# Patient Record
Sex: Female | Born: 1990 | Race: White | Hispanic: No | Marital: Single | Smoking: Never smoker
Health system: Southern US, Community
[De-identification: ages and names within clinical notes are randomized; demographics above are authoritative.]

## PROBLEM LIST (undated history)

## (undated) ENCOUNTER — Inpatient Hospital Stay (HOSPITAL_COMMUNITY): Payer: Self-pay

## (undated) DIAGNOSIS — R519 Headache, unspecified: Secondary | ICD-10-CM

## (undated) DIAGNOSIS — Z22322 Carrier or suspected carrier of Methicillin resistant Staphylococcus aureus: Secondary | ICD-10-CM

## (undated) DIAGNOSIS — S82891A Other fracture of right lower leg, initial encounter for closed fracture: Secondary | ICD-10-CM

## (undated) DIAGNOSIS — S82892A Other fracture of left lower leg, initial encounter for closed fracture: Secondary | ICD-10-CM

## (undated) DIAGNOSIS — R51 Headache: Secondary | ICD-10-CM

## (undated) HISTORY — DX: Headache: R51

## (undated) HISTORY — DX: Other fracture of right lower leg, initial encounter for closed fracture: S82.891A

## (undated) HISTORY — DX: Headache, unspecified: R51.9

## (undated) HISTORY — DX: Other fracture of right lower leg, initial encounter for closed fracture: S82.892A

## (undated) HISTORY — DX: Carrier or suspected carrier of methicillin resistant Staphylococcus aureus: Z22.322

## (undated) HISTORY — PX: OTHER SURGICAL HISTORY: SHX169

---

## 2012-05-10 ENCOUNTER — Ambulatory Visit: Payer: Self-pay | Admitting: Family Medicine

## 2012-05-10 DIAGNOSIS — Z0289 Encounter for other administrative examinations: Secondary | ICD-10-CM

## 2014-03-28 ENCOUNTER — Telehealth: Payer: Self-pay

## 2014-03-28 NOTE — Telephone Encounter (Signed)
Left message for call back Non-identifiable   NEW PATIENT 

## 2014-03-28 NOTE — Telephone Encounter (Signed)
Medication and allergies:  Reviewed and updated  90 day supply/mail order: n/a Local pharmacy:  [id number] [place] - [address]   Immunizations due:  UTD   A/P: No changes to personal, family history or past surgical hx PAP- Never had one Flu- Did not receive Tdap- 06/2012   To Discuss with Provider: Nothing at this time.

## 2014-03-31 ENCOUNTER — Encounter: Payer: Self-pay | Admitting: Family Medicine

## 2014-03-31 ENCOUNTER — Encounter: Payer: Self-pay | Admitting: General Practice

## 2014-03-31 ENCOUNTER — Ambulatory Visit (INDEPENDENT_AMBULATORY_CARE_PROVIDER_SITE_OTHER): Payer: BC Managed Care – PPO | Admitting: Family Medicine

## 2014-03-31 VITALS — BP 118/76 | HR 75 | Temp 98.8°F | Resp 14 | Ht 68.0 in | Wt 242.2 lb

## 2014-03-31 DIAGNOSIS — Z0000 Encounter for general adult medical examination without abnormal findings: Secondary | ICD-10-CM

## 2014-03-31 LAB — BASIC METABOLIC PANEL
BUN: 11 mg/dL (ref 6–23)
CO2: 25 mEq/L (ref 19–32)
Calcium: 9.5 mg/dL (ref 8.4–10.5)
Chloride: 104 mEq/L (ref 96–112)
Creatinine, Ser: 0.8 mg/dL (ref 0.4–1.2)
GFR: 93.64 mL/min (ref >60.00)
Glucose, Bld: 81 mg/dL (ref 70–99)
Potassium: 3.8 mEq/L (ref 3.5–5.1)
Sodium: 138 mEq/L (ref 135–145)

## 2014-03-31 LAB — LIPID PANEL
CHOLESTEROL: 207 mg/dL — AB (ref 0–200)
HDL: 43.9 mg/dL (ref >39.00)
LDL CALC: 144 mg/dL — AB (ref 0–99)
TRIGLYCERIDES: 96 mg/dL (ref 0.0–149.0)
Total CHOL/HDL Ratio: 5
VLDL: 19.2 mg/dL (ref 0.0–40.0)

## 2014-03-31 LAB — CBC WITH DIFFERENTIAL/PLATELET
Basophils Absolute: 0 10*3/uL (ref 0.0–0.1)
Basophils Relative: 0.5 % (ref 0.0–3.0)
EOS PCT: 0.8 % (ref 0.0–5.0)
Eosinophils Absolute: 0.1 10*3/uL (ref 0.0–0.7)
HCT: 40.1 % (ref 36.0–46.0)
HEMOGLOBIN: 13.3 g/dL (ref 12.0–15.0)
Lymphocytes Relative: 22.4 % (ref 12.0–46.0)
Lymphs Abs: 1.6 10*3/uL (ref 0.7–4.0)
MCHC: 33.1 g/dL (ref 30.0–36.0)
MCV: 87.5 fl (ref 78.0–100.0)
MONOS PCT: 4.5 % (ref 3.0–12.0)
Monocytes Absolute: 0.3 10*3/uL (ref 0.1–1.0)
Neutro Abs: 5.2 10*3/uL (ref 1.4–7.7)
Neutrophils Relative %: 71.8 % (ref 43.0–77.0)
PLATELETS: 351 10*3/uL (ref 150.0–400.0)
RBC: 4.58 Mil/uL (ref 3.87–5.11)
RDW: 13.5 % (ref 11.5–15.5)
WBC: 7.3 10*3/uL (ref 4.0–10.5)

## 2014-03-31 LAB — HEPATIC FUNCTION PANEL
ALT: 15 U/L (ref 0–35)
AST: 18 U/L (ref 0–37)
Albumin: 4.1 g/dL (ref 3.5–5.2)
Alkaline Phosphatase: 57 U/L (ref 39–117)
Bilirubin, Direct: 0 mg/dL (ref 0.0–0.3)
Total Bilirubin: 0.7 mg/dL (ref 0.2–1.2)
Total Protein: 8 g/dL (ref 6.0–8.3)

## 2014-03-31 LAB — TSH: TSH: 2.16 u[IU]/mL (ref 0.35–4.50)

## 2014-03-31 NOTE — Patient Instructions (Signed)
Schedule your pap only at your convenience (when you're home this summer) We'll notify you of your lab results and make any changes if needed Continue to make healthy food choices and get regular exercise Call with any questions or concerns Welcome!  We're glad to have you!!!

## 2014-03-31 NOTE — Assessment & Plan Note (Signed)
Pt's PE WNL w/ exception of obesity.  Check labs.  Encouraged healthy diet and regular exercise.  Anticipatory guidance provided.  

## 2014-03-31 NOTE — Progress Notes (Signed)
Pre visit review using our clinic review tool, if applicable. No additional management support is needed unless otherwise documented below in the visit note. 

## 2014-03-31 NOTE — Progress Notes (Signed)
   Subjective:    Patient ID: Katrina Perez, female    DOB: [birth year], 23 y.o.   MRN: [id number]  HPI New to establish.  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  CPE- no concerns.  Has never had pap.  Has been sexually active.   Review of Systems Patient reports no vision/ hearing changes, adenopathy,fever, weight change,  persistant/recurrent hoarseness , swallowing issues, chest pain, palpitations, edema, persistant/recurrent cough, hemoptysis, dyspnea (rest/exertional/paroxysmal nocturnal), gastrointestinal bleeding (melena, rectal bleeding), abdominal pain, significant heartburn, bowel changes, GU symptoms (dysuria, hematuria, incontinence), Gyn symptoms (abnormal  bleeding, pain),  syncope, focal weakness, memory loss, numbness & tingling, skin/hair/nail changes, abnormal bruising or bleeding, anxiety, or depression.     Objective:   Physical Exam General Appearance:    Alert, cooperative, no distress, appears stated age  Head:    Normocephalic, without obvious abnormality, atraumatic  Eyes:    PERRL, conjunctiva/corneas clear, EOM's intact, fundi    benign, both eyes  Ears:    Normal TM's and external ear canals, both ears  Nose:   Nares normal, septum midline, mucosa normal, no drainage    or sinus tenderness  Throat:   Lips, mucosa, and tongue normal; teeth and gums normal  Neck:   Supple, symmetrical, trachea midline, no adenopathy;    Thyroid: no enlargement/tenderness/nodules  Back:     Symmetric, no curvature, ROM normal, no CVA tenderness  Lungs:     Clear to auscultation bilaterally, respirations unlabored  Chest Wall:    No tenderness or deformity   Heart:    Regular rate and rhythm, S1 and S2 normal, no murmur, rub   or gallop  Breast Exam:    Deferred  Abdomen:     Soft, non-tender, bowel sounds active all four quadrants,    no masses, no organomegaly  Genitalia:    Deferred  Rectal:    Extremities:   Extremities normal, atraumatic, no cyanosis or edema    Pulses:   2+ and symmetric all extremities  Skin:   Skin color, texture, turgor normal, no rashes or lesions  Lymph nodes:   Cervical, supraclavicular, and axillary nodes normal  Neurologic:   CNII-XII intact, normal strength, sensation and reflexes    throughout          Assessment & Plan:

## 2014-04-05 LAB — VITAMIN D 1,25 DIHYDROXY
VITAMIN D 1, 25 (OH) TOTAL: 41 pg/mL (ref 18–72)
Vitamin D3 1, 25 (OH)2: 41 pg/mL

## 2017-01-30 ENCOUNTER — Ambulatory Visit (INDEPENDENT_AMBULATORY_CARE_PROVIDER_SITE_OTHER): Payer: Medicaid Other | Admitting: Advanced Practice Midwife

## 2017-01-30 ENCOUNTER — Encounter: Payer: Self-pay | Admitting: Advanced Practice Midwife

## 2017-01-30 ENCOUNTER — Other Ambulatory Visit (HOSPITAL_COMMUNITY)
Admission: RE | Admit: 2017-01-30 | Discharge: 2017-01-30 | Disposition: A | Discharge status: Home / Self Care | Payer: Medicaid Other | Source: Ambulatory Visit | Attending: Advanced Practice Midwife | Admitting: Advanced Practice Midwife

## 2017-01-30 VITALS — BP 138/89 | HR 106 | Wt 270.0 lb

## 2017-01-30 DIAGNOSIS — O219 Vomiting of pregnancy, unspecified: Secondary | ICD-10-CM | POA: Diagnosis not present

## 2017-01-30 DIAGNOSIS — O0932 Supervision of pregnancy with insufficient antenatal care, second trimester: Secondary | ICD-10-CM | POA: Diagnosis not present

## 2017-01-30 DIAGNOSIS — Z3402 Encounter for supervision of normal first pregnancy, second trimester: Secondary | ICD-10-CM

## 2017-01-30 DIAGNOSIS — Z113 Encounter for screening for infections with a predominantly sexual mode of transmission: Secondary | ICD-10-CM | POA: Diagnosis not present

## 2017-01-30 DIAGNOSIS — O09899 Supervision of other high risk pregnancies, unspecified trimester: Secondary | ICD-10-CM | POA: Insufficient documentation

## 2017-01-30 DIAGNOSIS — E669 Obesity, unspecified: Secondary | ICD-10-CM | POA: Diagnosis not present

## 2017-01-30 DIAGNOSIS — O0930 Supervision of pregnancy with insufficient antenatal care, unspecified trimester: Secondary | ICD-10-CM | POA: Insufficient documentation

## 2017-01-30 DIAGNOSIS — O99212 Obesity complicating pregnancy, second trimester: Secondary | ICD-10-CM | POA: Diagnosis not present

## 2017-01-30 DIAGNOSIS — O99210 Obesity complicating pregnancy, unspecified trimester: Secondary | ICD-10-CM

## 2017-01-30 DIAGNOSIS — Z3A16 16 weeks gestation of pregnancy: Secondary | ICD-10-CM

## 2017-01-30 MED ORDER — DOXYLAMINE-PYRIDOXINE 10-10 MG PO TBEC: 2.0000 | DELAYED_RELEASE_TABLET | Freq: Every day | ORAL | 5 refills | Status: DC

## 2017-01-30 NOTE — Patient Instructions (Signed)
Second Trimester of Pregnancy The second trimester is from week 13 through week 28, month 4 through 6. This is often the time in pregnancy that you feel your best. Often times, morning sickness has lessened or quit. You may have more energy, and you may get hungry more often. Your unborn baby (fetus) is growing rapidly. At the end of the sixth month, he or she is about 9 inches long and weighs about 1 pounds. You will likely feel the baby move (quickening) between 18 and 20 weeks of pregnancy. Follow these instructions at home:  Avoid all smoking, herbs, and alcohol. Avoid drugs not approved by your doctor.  Do not use any tobacco products, including cigarettes, chewing tobacco, and electronic cigarettes. If you need help quitting, ask your doctor. You may get counseling or other support to help you quit.  Only take medicine as told by your doctor. Some medicines are safe and some are not during pregnancy.  Exercise only as told by your doctor. Stop exercising if you start having cramps.  Eat regular, healthy meals.  Wear a good support bra if your breasts are tender.  Do not use hot tubs, steam rooms, or saunas.  Wear your seat belt when driving.  Avoid raw meat, uncooked cheese, and liter boxes and soil used by cats.  Take your prenatal vitamins.  Take 1500-2000 milligrams of calcium daily starting at the 20th week of pregnancy until you deliver your baby.  Try taking medicine that helps you poop (stool softener) as needed, and if your doctor approves. Eat more fiber by eating fresh fruit, vegetables, and whole grains. Drink enough fluids to keep your pee (urine) clear or pale yellow.  Take warm water baths (sitz baths) to soothe pain or discomfort caused by hemorrhoids. Use hemorrhoid cream if your doctor approves.  If you have puffy, bulging veins (varicose veins), wear support hose. Raise (elevate) your feet for 15 minutes, 3-4 times a day. Limit salt in your diet.  Avoid heavy  lifting, wear low heals, and sit up straight.  Rest with your legs raised if you have leg cramps or low back pain.  Visit your dentist if you have not gone during your pregnancy. Use a soft toothbrush to brush your teeth. Be gentle when you floss.  You can have sex (intercourse) unless your doctor tells you not to.  Go to your doctor visits. Get help if:  You feel dizzy.  You have mild cramps or pressure in your lower belly (abdomen).  You have a nagging pain in your belly area.  You continue to feel sick to your stomach (nauseous), throw up (vomit), or have watery poop (diarrhea).  You have bad smelling fluid coming from your vagina.  You have pain with peeing (urination). Get help right away if:  You have a fever.  You are leaking fluid from your vagina.  You have spotting or bleeding from your vagina.  You have severe belly cramping or pain.  You lose or gain weight rapidly.  You have trouble catching your breath and have chest pain.  You notice sudden or extreme puffiness (swelling) of your face, hands, ankles, feet, or legs.  You have not felt the baby move in over an hour.  You have severe headaches that do not go away with medicine.  You have vision changes. This information is not intended to replace advice given to you by your health care provider. Make sure you discuss any questions you have with your health care   provider. Document Released: 02/01/2010 Document Revised: 04/14/2016 Document Reviewed: 01/08/2013 Elsevier Interactive Patient Education  2017 Elsevier Inc.  

## 2017-01-30 NOTE — Addendum Note (Signed)
Addended by: Granville LewisLARK, Luverta Korte L on: 01/30/2017 02:49 PM   Modules accepted: Orders

## 2017-01-30 NOTE — Progress Notes (Signed)
[place] [facility] [place] 1 [place]2 [place].  WNL

## 2017-01-30 NOTE — Progress Notes (Signed)
Subjective:    Katrina Perez is a G1P0 [redacted]w[redacted]d being seen today for her first obstetrical visit.  Her obstetrical history is significant for obesity and Late prenatal care, nausea and vomiting of pregnancy. Patient does intend to breast feed. Pregnancy history fully reviewed.  Patient reports nausea and vomiting.  Vitals:   01/30/17 0944  BP: 138/89  Pulse: (!) 106  Weight: 270 lb (122.5 kg)    HISTORY: OB History  Gravida Para Term Preterm AB Living  1            SAB TAB Ectopic Multiple Live Births               # Outcome Date GA Lbr Len/2nd Weight Sex Delivery Anes PTL Lv  1 Current              Past Medical History:  Diagnosis Date  . Bilateral ankle fractures   . Frequent headaches    tension headaches per patient  . MRSA (methicillin resistant staph aureus) culture positive    bilater armpits   Past Surgical History:  Procedure Laterality Date  . left hip surgery     "replace hip in socket"   Family History  Problem Relation Age of Onset  . Hypertension      grandparents on both sides  . Heart attack Maternal Grandfather   . Hyperlipidemia Maternal Grandfather   . Diabetes Maternal Grandfather   . Colon cancer      paternal great grandfather  . Depression Mother     and mother's side of family  . Bipolar disorder Mother     and mother's side of family  . Anxiety disorder Mother     and mother's side of family  . Diabetes Maternal Aunt      Exam    Uterus:  Fundal Height: 16 cm  Pelvic Exam:    Perineum: Not examined   Vulva: deferred   Vagina:  normal discharge   pH:    Cervix: nulliparous appearance   Adnexa: not evaluated   Bony Pelvis: gynecoid  System: Breast:  normal appearance, no masses or tenderness   Skin: normal coloration and turgor, no rashes    Neurologic: oriented   Extremities: normal strength, tone, and muscle mass   HEENT neck supple with midline trachea   Mouth/Teeth mucous membranes moist, pharynx normal without  lesions   Neck supple and no masses   Cardiovascular: regular rate and rhythm   Respiratory:  appears well, vitals normal, no respiratory distress, acyanotic, normal RR, ear and throat exam is normal, neck free of mass or lymphadenopathy, chest clear, no wheezing, crepitations, rhonchi, normal symmetric air entry   Abdomen: soft, non-tender; bowel sounds normal; no masses,  no organomegaly   Urinary: bladder not palpable      Assessment:    Pregnancy: G1P0 Patient Active Problem List   Diagnosis Date Noted  . Supervision of normal first pregnancy 01/30/2017  . Late prenatal care 01/30/2017  . Nausea/vomiting in pregnancy 01/30/2017  . Routine general medical examination at a health care facility 03/31/2014        Plan:     Initial labs drawn. Prenatal vitamins. Problem list reviewed and updated. Genetic Screening discussed Quad Screen: ordered.  Ultrasound discussed; fetal survey: ordered.  Follow up in 4 weeks. 50% of 30 min visit spent on counseling and coordination of care.   Welcomed to practice Routines reviewed Will Rx Diclegis for nausea/vomitiing which has persisted Discussed if this does not  work, may move to [employer]henergan or Zofran, Reviewed small risk of defects with zofran, but will used if necessary.   Wynelle BourgeoisMarie Chrishana Spargur 01/30/2017

## 2017-01-31 LAB — AFP, QUAD SCREEN
AFP: 50.1 ng/mL
Age Alone: 1:1030 {titer}
Curr Gest Age: 16 weeks
Down Syndrome Scr Risk Est: 1:28500 {titer}
HCG, Total: 28.6 IU/mL
INH: 165.2 pg/mL
Interpretation-AFP: NEGATIVE
MoM for AFP: 2.31
MoM for INH: 1.5
MoM for hCG: 1.1
OPEN SPINA BIFIDA: NEGATIVE
Tri 18 Scr Risk Est: NEGATIVE
UE3 MOM: 0.72
UE3 VALUE: 0.47 ng/mL

## 2017-01-31 LAB — PRENATAL PROFILE (SOLSTAS)
Antibody Screen: NEGATIVE
Basophils Absolute: 0 cells/uL (ref 0–200)
Basophils Relative: 0 %
EOS ABS: 98 {cells}/uL (ref 15–500)
Eosinophils Relative: 1 %
HCT: 35 % (ref 35.0–45.0)
HIV: NONREACTIVE
Hemoglobin: 11.8 g/dL (ref 11.7–15.5)
Hepatitis B Surface Ag: NEGATIVE
LYMPHS PCT: 15 %
Lymphs Abs: 1470 cells/uL (ref 850–3900)
MCH: 29.5 pg (ref 27.0–33.0)
MCHC: 33.7 g/dL (ref 32.0–36.0)
MCV: 87.5 fL (ref 80.0–100.0)
MONOS PCT: 7 %
MPV: 9.5 fL (ref 7.5–12.5)
Monocytes Absolute: 686 cells/uL (ref 200–950)
Neutro Abs: 7546 cells/uL (ref 1500–7800)
Neutrophils Relative %: 77 %
PLATELETS: 301 10*3/uL (ref 140–400)
RBC: 4 MIL/uL (ref 3.80–5.10)
RDW: 13.7 % (ref 11.0–15.0)
RH TYPE: POSITIVE
Rubella: 1.08 Index — ABNORMAL HIGH (ref <0.90)
WBC: 9.8 10*3/uL (ref 3.8–10.8)

## 2017-02-01 LAB — URINE CYTOLOGY ANCILLARY ONLY
Chlamydia: NEGATIVE
Neisseria Gonorrhea: NEGATIVE

## 2017-02-02 LAB — CULTURE, OB URINE

## 2017-02-05 ENCOUNTER — Other Ambulatory Visit: Payer: Self-pay | Admitting: Advanced Practice Midwife

## 2017-02-05 ENCOUNTER — Encounter: Payer: Self-pay | Admitting: Advanced Practice Midwife

## 2017-02-05 MED ORDER — CEPHALEXIN 500 MG PO CAPS: 500.0000 mg | ORAL_CAPSULE | Freq: Four times a day (QID) | ORAL | 2 refills | Status: DC

## 2017-02-05 NOTE — Progress Notes (Signed)
E coli UTI  Rx Keflex

## 2017-02-23 ENCOUNTER — Other Ambulatory Visit: Payer: Self-pay | Admitting: Advanced Practice Midwife

## 2017-02-23 ENCOUNTER — Ambulatory Visit (HOSPITAL_COMMUNITY)
Admission: RE | Admit: 2017-02-23 | Discharge: 2017-02-23 | Disposition: A | Discharge status: Home / Self Care | Payer: Medicaid Other | Source: Ambulatory Visit | Attending: Advanced Practice Midwife | Admitting: Advanced Practice Midwife

## 2017-02-23 ENCOUNTER — Other Ambulatory Visit (HOSPITAL_COMMUNITY): Payer: Self-pay | Admitting: *Deleted

## 2017-02-23 DIAGNOSIS — Z3A16 16 weeks gestation of pregnancy: Secondary | ICD-10-CM

## 2017-02-23 DIAGNOSIS — Z3A19 19 weeks gestation of pregnancy: Secondary | ICD-10-CM

## 2017-02-23 DIAGNOSIS — O99212 Obesity complicating pregnancy, second trimester: Secondary | ICD-10-CM | POA: Insufficient documentation

## 2017-02-23 DIAGNOSIS — IMO0002 Reserved for concepts with insufficient information to code with codable children: Secondary | ICD-10-CM

## 2017-02-23 DIAGNOSIS — Z3689 Encounter for other specified antenatal screening: Secondary | ICD-10-CM | POA: Insufficient documentation

## 2017-02-23 DIAGNOSIS — Z363 Encounter for antenatal screening for malformations: Secondary | ICD-10-CM | POA: Diagnosis not present

## 2017-02-23 DIAGNOSIS — Q899 Congenital malformation, unspecified: Secondary | ICD-10-CM

## 2017-02-23 DIAGNOSIS — Z6841 Body Mass Index (BMI) 40.0 and over, adult: Secondary | ICD-10-CM | POA: Insufficient documentation

## 2017-02-23 DIAGNOSIS — Z0489 Encounter for examination and observation for other specified reasons: Secondary | ICD-10-CM

## 2017-02-23 DIAGNOSIS — O283 Abnormal ultrasonic finding on antenatal screening of mother: Secondary | ICD-10-CM

## 2017-02-23 NOTE — Progress Notes (Addendum)
Genetic Counseling  High-Risk Gestation Note  Appointment Date:  02/23/2017 Referred By: Seabron Spates, CNM Date of Birth:  10/16/1991 Partner:  Katrina Perez   Pregnancy History: G1P0 Estimated Date of Delivery: 07/17/17 Estimated Gestational Age: 38w3dAttending: [place] MD  I met with Ms. Katrina Perez and her partner, Katrina Perez for genetic counseling because of abnormal ultrasound findings today.   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  Reviewed possible etiologies  Findings in combination with borderline AFP (2.31 MoM), suspicious for open neural tube defect, though some views of fetal spine were limited today  Sporadic, environmental, infectious, chromosomal, single gene  Reviewed options for additional screening  NIPS- elected to pursue today ([facility])  Repeat MSAFP (to assess for regression to the mean) - elected to pursue today[facility]  Echocardiogram- being facilitated for the patient  Ongoing ultrasound- follow-up scheduled 03/16/17  Fetal MRI at later gestation- patient to consider this at later date  Expanded pan-ethnic carrier screening panel - declined today  Reviewed options for diagnostic testing, including risks, benefits, limitations and alternatives  Amniocentesis for AFAFP and chromosome analysis - declined today  Reviewed family history concerns  We began by reviewing the ultrasound in detail. [facility] visualized bilateral ventriculomegaly (13 mm bilaterally), banana shaped cerebellum, and lemon shaped cranium. No obvious defects of fetal spine were visualized today, but limited views of sacral spine were  obtained today. Bilateral echogenic intracardiac foci were visualized. Complete ultrasound results under separate cover.   We counseled the couple that ventriculomegaly refers to an increased measurement of the lateral ventricles in the brain greater than 10 mm. Possible causes for mild ventriculomegaly include overproduction of cerebrospinal fluid, a blockage in a duct causing the fluid to accumulate, an intrauterine infection, or a variation of normal. We discussed that once ventriculomegaly is identified in pregnancy, follow-up ultrasounds are offered to assess for progression of ventriculomegaly. Following delivery, post-natal studies may be required in order to track progress and assess for any other differences in brain anatomy. It was discussed that surgical intervention may be required in some cases of ventriculomegaly. Most cases of isolated mild ventriculomegaly do not have an identified cause or associated condition, tend to regress with time, and have no resulting health or learning problems. However, it is possible that this finding may be seen in association with other birth defects or a genetic condition. There is also a slightly increased chance of developmental delays.   We specifically discussed that the brain and head anatomy are consistent with Katrina AwkwardChiari II malformation, which is expected to be present due to an open neural tube defect. We discussed that suspicion for open spina bifida, though no obvious defect was visualized on the fetal spine today. However, views of the fetal sacral spine were quite limited today.  Ms. MSolarzpreviously had Quad screening, which was within normal limits, but the MSAFP was in the borderline range (2.31 MoM).    We briefly discussed the range in variability of open neural tube defects. In cases of open neural tube defects, surgery to close the opening in the spine is performed within the first 24 to 48 hours after birth for most cases.  Additionally,  there is limited availability of  in utero NTD repair.  They understand that this is available at several centers in the [place], but in order to be eligible, there are certain criteria which must be met. We discussed additional screening and testing options to assess for ONTDs in the current pregnancy including repeating MSAFP (to assess for regression to the mean), fetal MRI at a later gestation, repeat ultrasound, and amniocentesis to assess amniotic fluid AFP and acetylcholinesterase. We reviewed the risks, benefits, and limitations of each of these screening and testing methods. Specifically, for amniocentesis, we discussed the associated 1 in 300-500 risk for complications, including spontaneous pregnancy loss. After careful consideration, Katrina Perez elected to repeat MSAFP (sent to [facility], given that this is the laboratory that analyzed the first Quad screen sample) and declined amniocentesis today. Follow-up ultrasound is scheduled for 03/16/17, and the patient would like to pursue fetal MRI at a later date.   We discussed less likely explanations for the ultrasound findings. We discussed that the presence of ventriculomegaly is associated with an increased risk for aneuploidy, specifically Down syndrome. We reviewed chromosomes, nondisjunction, and the features and prognosis of Down syndrome, trisomy 18, and trisomy 13. They were counseled regarding maternal age and the association with risk for chromosome conditions due to nondisjunction with aging of ova. Katrina Perez Quad screen reduced the risks for fetal Down syndrome (1 in 28,500) and trisomy 18 (1 in 45,000). We reviewed the sensitivity of this screen; they understand that Quad screen adjusts the a priori risk for certain conditions but is no diagnostic and does not assess for all chromosome conditions.  We discussed additionally the atypical ultrasound findings can less commonly be caused by other chromosome aberrations, such as deletions,  duplications, or inversions. We discussed that screening option of noninvasive prenatal screening (NIPS)/prenatal cell free DNA testing. We reviewed the methodology, the conditions for which it assesses, and the detection rates of each. They understand that this is highly sensitive and specific but is not considered diagnostic and also does not screen for all chromosome conditions. We reviewed the diagnostic testing option of amniocentesis for karyotype and microarray analysis including the associated risk, benefits, and limitations, as discussed above. After careful consideration, Katrina Perez elected to proceed with NIPS ([facility] through [facility]) today and declined amniocentesis at this time.   We reviewed that viral intrauterine infections can also lead to ventriculomegaly. Katrina Perez elected to pursue maternal toxoplasmosis and CMV IgG and IgM titers today.   We also reviewed that single gene conditions can also cause abnormal ultrasound findings. We briefly reviewed various patterns of inheritance including sporadic, autosomal dominant, autosomal recessive, and X-linked. We reviewed the challenging aspect of prenatal testing for single gene conditions and reviewed that this typically would not be available on amniocentesis unless ultrasound and/or family history were suggestive of a particular single gene condition for which prenatal diagnosis was available. We discussed the option of expanded pan-ethnic carrier screening to assess for carrier status for the patient for a select panel of autosomal recessive and X-linked conditions. ACOG currently recommends that all patients be offered carrier screening for cystic fibrosis, spinal muscular atrophy and hemoglobinopathies. In addition, they were counseled that there are a variety of genetic screening laboratories that have pan-ethnic, or expanded, carrier screening panels, which evaluate carrier status for a wide range of genetic conditions. Some  of these conditions are severe and actionable, but also rare; others occur more commonly, but are less severe. We discussed that testing options range from screening for a   single condition to panels of more than 175 autosomal or X-linked genetic conditions. We reviewed that the prevalence of each condition varies (and often varies with ethnicity). Thus the couples' background risk to be a carrier for each of these various conditions would range, and in some cases be very low or unknown. Similarly, the detection rate varies with each condition and also varies in some cases with ethnicity, ranging from greater than 99% (in the case of hemoglobinopathies) to unknown. We reviewed that a negative carrier screen would thus reduce, but not eliminate the chance to be a carrier for these conditions. We discussed the wide range of phenotypes of conditions included on this panel and that some may pertain for the ultrasound findings but that many would not.  We reviewed that in the event that one partner is found to be a carrier for one or more conditions, carrier screening would be available to the partner for those conditions. Katrina Perez declined expanded pan-ethnic carrier screening today but may further consider this option in the future.   Additionally, the couple will have the option of a prenatal consult with neonatology and pediatric neurosurgery in the future, which we can help facilitate, if desired. They understand that the prognosis and recurrence risk for future pregnancies depends upon the underlying etiology. They understand that available prenatal screening and testing methods cannot identify and rule out all birth defects or genetic conditions. The couple stated that regardless of the underlying etiology, they plan to continue the pregnancy.    Both family histories were reviewed and found to be noncontributory for birth defects, intellectual disability, and known genetic conditions. Katrina Perez reported  Irish and Native American ancestry, and her partner reported [place] ancestry. The couple denied consanguinity. Without further information regarding the provided family history, an accurate genetic risk cannot be calculated. Further genetic counseling is warranted if more information is obtained.  Katrina Perez denied exposure to environmental toxins or chemical agents. She denied the use of tobacco or street drugs. She reported occasional, social alcohol exposure prior to being aware of the pregnancy. She reported that this occurred possibly on average once per week with minimal amount at each time prior to [redacted] weeks gestation. She reported no alcohol use since [redacted] weeks gestation. The all-or-none period was discussed, meaning exposures that occur in the first 4 weeks of gestation are typically thought to either not affect the pregnancy at all or result in a miscarriage.  Prenatal alcohol exposure can increase the risk for growth delays, small head size, heart defects, eye and facial differences, as well as behavior problems and learning disabilities. The risk of these to occur tends to increase with the amount of alcohol consumed. However, because there is no identified safe amount of alcohol in pregnancy, it is recommended to completely avoid alcohol in pregnancy. Given the reported amount of exposure, risk for associated effects are likely low in the current pregnancy. She denied significant viral illnesses during the course of her pregnancy. Her medical and surgical histories were noncontributory.   I counseled this couple regarding the above risks and available options.  The approximate face-to-face time with the genetic counselor was 45 minutes.  Karen Corneliussen, MS Certified Genetic Counselor 02/23/2017  

## 2017-02-24 LAB — TOXOPLASMA ANTIBODIES- IGG AND  IGM
Toxoplasma Antibody- IgM: <3 AU/mL (ref 0.0–7.9)
Toxoplasma IgG Ratio: <3 IU/mL (ref 0.0–7.1)

## 2017-02-24 LAB — CMV IGM: CMV IgM: <30 AU/mL (ref 0.0–29.9)

## 2017-02-24 LAB — CMV ANTIBODY, IGG (EIA): CMV Ab - IgG: 3.1 U/mL — ABNORMAL HIGH (ref 0.00–0.59)

## 2017-02-24 NOTE — Addendum Note (Signed)
Encounter addended by: Dessie Coma Camela Wich on: 02/24/2017  2:36 PM<BR>    Actions taken: Sign clinical note

## 2017-02-27 ENCOUNTER — Ambulatory Visit (INDEPENDENT_AMBULATORY_CARE_PROVIDER_SITE_OTHER): Payer: Medicaid Other | Admitting: Advanced Practice Midwife

## 2017-02-27 ENCOUNTER — Encounter: Payer: Self-pay | Admitting: Advanced Practice Midwife

## 2017-02-27 VITALS — BP 144/84 | HR 109 | Wt 271.0 lb

## 2017-02-27 DIAGNOSIS — Z3402 Encounter for supervision of normal first pregnancy, second trimester: Secondary | ICD-10-CM

## 2017-02-27 DIAGNOSIS — O283 Abnormal ultrasonic finding on antenatal screening of mother: Secondary | ICD-10-CM

## 2017-02-27 DIAGNOSIS — O169 Unspecified maternal hypertension, unspecified trimester: Secondary | ICD-10-CM | POA: Insufficient documentation

## 2017-02-27 DIAGNOSIS — O162 Unspecified maternal hypertension, second trimester: Secondary | ICD-10-CM

## 2017-02-27 NOTE — Patient Instructions (Signed)

## 2017-02-28 LAB — PROTEIN / CREATININE RATIO, URINE
Creatinine, Urine: 142 mg/dL (ref 20–320)
Protein Creatinine Ratio: 120 mg/g creat (ref 21–161)
TOTAL PROTEIN, URINE: 17 mg/dL (ref 5–24)

## 2017-03-02 ENCOUNTER — Telehealth (HOSPITAL_COMMUNITY): Payer: Self-pay | Admitting: Obstetrics and Gynecology

## 2017-03-02 LAB — MISCELLANEOUS TEST

## 2017-03-02 MED ORDER — ASPIRIN EC 81 MG PO TBEC: 81.0000 mg | DELAYED_RELEASE_TABLET | Freq: Every day | ORAL | 2 refills | Status: DC

## 2017-03-02 NOTE — Progress Notes (Signed)
   PRENATAL VISIT NOTE  Subjective:  Katrina Perez is a 26 y.o. G1P0 at [redacted]w[redacted]d being seen today for ongoing prenatal care.  She is currently monitored for the following issues for this high-risk pregnancy and has Routine general medical examination at a health care facility; Supervision of normal first pregnancy; Late prenatal care; Nausea/vomiting in pregnancy; Abnormal fetal ultrasound; [redacted] weeks gestation of pregnancy; and Hypertension affecting pregnancy on her problem list.  Patient reports no complaints.   . Vag. Bleeding: None.  Movement: Present. Denies leaking of fluid.   The following portions of the patient's history were reviewed and updated as appropriate: allergies, current medications, past family history, past medical history, past social history, past surgical history and problem list. Problem list updated.  Anatomy US showed Ventriculomegaly, Chiari II, suspected, but not visualized NTD.  Objective:   Vitals:   02/27/17 0855  BP: (!) 144/84  Pulse: (!) 109  Weight: 271 lb (122.9 kg)    Fetal Status: Fetal Heart Rate (bpm): 150 Fundal Height: 20 cm Movement: Present     General:  Alert, oriented and cooperative. Patient is in no acute distress.  Skin: Skin is warm and dry. No rash noted.   Cardiovascular: Normal heart rate noted  Respiratory: Normal respiratory effort, no problems with respiration noted  Abdomen: Soft, gravid, appropriate for gestational age. Pain/Pressure: Absent     Pelvic:  Cervical exam deferred        Extremities: Normal range of motion.  Edema: None  Mental Status: Normal mood and affect. Normal behavior. Normal judgment and thought content.   Assessment and Plan:  Pregnancy: G1P0 at [redacted]w[redacted]d  1. Abnormal fetal ultrasound - F/U scheduled. See MFM recommendations based on results of [place].  2. Hypertension affecting pregnancy, second trimester-stable  - Protein / creatinine ratio, urine  3. Encounter for supervision of high risk first  pregnancy in second trimester   Preterm labor symptoms and general obstetric precautions including but not limited to vaginal bleeding, contractions, leaking of fluid and fetal movement were reviewed in detail with the patient. Please refer to After Visit Summary for other counseling recommendations.  Return in about 4 weeks (around 03/27/2017) for ROB.   Dorathy Kinsman, CNM

## 2017-03-02 NOTE — Telephone Encounter (Signed)
MFM  Called patient to let her know her CMV and toxoplasmosis returned negative for recent infection.  Patient had no questions.  Eulis Foster, MD

## 2017-03-06 ENCOUNTER — Telehealth (HOSPITAL_COMMUNITY): Payer: Self-pay | Admitting: MS"

## 2017-03-06 ENCOUNTER — Encounter (HOSPITAL_COMMUNITY): Payer: Self-pay

## 2017-03-06 NOTE — Telephone Encounter (Signed)
Called Katrina Perez to discuss her prenatal cell free DNA test results.  Ms. Katrina Perez had Panorama testing through [facility].  Testing was offered because of abnormal ultrasound findings.   The patient was identified by name and DOB.  We reviewed that these are within normal limits, showing a less than 1 in 10,000 risk for trisomies 21, 18 and 13, and monosomy X (Turner syndrome).  In addition, the risk for triploidy and sex chromosome trisomies (47,XXX and 47,XXY) was also low risk.  Katrina Perez elected to have cffDNA analysis for 22q11 deletion syndrome, which was also low risk (1 in 2900).  We reviewed that this testing identifies > 99% of pregnancies with trisomy 69, trisomy 15, sex chromosome trisomies (47,XXX and 47,XXY), and triploidy. The detection rate for trisomy 18 is 96%.  The detection rate for monosomy X is ~92%.  The false positive rate is <0.1% for all conditions. Testing was also consistent with female fetal sex.  The patient did wish to know fetal sex.  She understands that this testing does not identify all genetic conditions.    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 Briefly discussed treatment options of spina bifida including in utero versus postnatal repair. Patient has follow-up ultrasound scheduled 4/26 and would like to plan follow-up genetic counseling to further discuss management options following the findings from that day's ultrasound. All questions were answered to her satisfaction, she was encouraged to call with additional questions or concerns.  Quinn Plowman, MS Patent attorney

## 2017-03-07 ENCOUNTER — Encounter: Payer: Self-pay | Admitting: Advanced Practice Midwife

## 2017-03-07 DIAGNOSIS — R772 Abnormality of alphafetoprotein: Secondary | ICD-10-CM | POA: Insufficient documentation

## 2017-03-07 DIAGNOSIS — O35BXX0 Maternal care for other (suspected) fetal abnormality and damage, fetal cardiac anomalies, not applicable or unspecified: Secondary | ICD-10-CM | POA: Insufficient documentation

## 2017-03-07 DIAGNOSIS — O358XX0 Maternal care for other (suspected) fetal abnormality and damage, not applicable or unspecified: Secondary | ICD-10-CM | POA: Insufficient documentation

## 2017-03-16 ENCOUNTER — Encounter (HOSPITAL_COMMUNITY): Payer: Self-pay

## 2017-03-16 ENCOUNTER — Ambulatory Visit (HOSPITAL_COMMUNITY)
Admission: RE | Admit: 2017-03-16 | Discharge: 2017-03-16 | Disposition: A | Discharge status: Home / Self Care | Payer: Medicaid Other | Source: Ambulatory Visit | Attending: Advanced Practice Midwife | Admitting: Advanced Practice Midwife

## 2017-03-16 VITALS — BP 135/75 | HR 103 | Wt 270.8 lb

## 2017-03-16 DIAGNOSIS — O99212 Obesity complicating pregnancy, second trimester: Secondary | ICD-10-CM | POA: Diagnosis not present

## 2017-03-16 DIAGNOSIS — E669 Obesity, unspecified: Secondary | ICD-10-CM | POA: Insufficient documentation

## 2017-03-16 DIAGNOSIS — Z6841 Body Mass Index (BMI) 40.0 and over, adult: Secondary | ICD-10-CM | POA: Insufficient documentation

## 2017-03-16 DIAGNOSIS — O359XX0 Maternal care for (suspected) fetal abnormality and damage, unspecified, not applicable or unspecified: Secondary | ICD-10-CM

## 2017-03-16 DIAGNOSIS — Q899 Congenital malformation, unspecified: Secondary | ICD-10-CM

## 2017-03-16 DIAGNOSIS — Z362 Encounter for other antenatal screening follow-up: Secondary | ICD-10-CM

## 2017-03-16 DIAGNOSIS — O350XX0 Maternal care for (suspected) central nervous system malformation in fetus, not applicable or unspecified: Secondary | ICD-10-CM | POA: Diagnosis not present

## 2017-03-16 DIAGNOSIS — Z3A22 22 weeks gestation of pregnancy: Secondary | ICD-10-CM | POA: Insufficient documentation

## 2017-03-16 DIAGNOSIS — O3508X0 Maternal care for (suspected) central nervous system malformation or damage in fetus, spina bifida, not applicable or unspecified: Secondary | ICD-10-CM

## 2017-03-16 NOTE — Progress Notes (Signed)
Genetic Counseling  High-Risk Gestation Note  Appointment Date:  03/16/2017 Referred By: Seabron Spates, CNM Date of Birth:  May 07, 1991   Pregnancy History: G1P0 Estimated Date of Delivery: 07/17/17 Estimated Gestational Age: 10w3dAttending: MRenella Cunas MD   I met with Ms. Katrina Perez for follow-up genetic counseling because of abnormal ultrasound findings. She was accompanied by her friend to today's visit.    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 Follow-up ultrasound today again visualized Arnold Chiari II malformation, and sacral open neural tube defect was visualized. Completed ultrasound results under separate cover.    We discussed that spina bifida is one of the most common birth differences and affects approximately 1 in 600 babies each year [place]  We reviewed that the spine contains nerves that control the legs, bladder, and bowel.  If there is an opening or a change in the development of the spine, the nerves can be damaged or incompletely formed.  We also discussed that babies with spina bifida can have a range of health concerns, which partially depends on where the opening is located along the spine.  Typically, openings in the lower spine cause fewer health concerns than openings in the upper spine.  Individuals with spina bifida may have muscle weakness, some degree of paralysis in the lower limbs, difficulty controlling bladder and bowel function, fluid build-up in the ventricles of the brain (ventriculomegaly), and learning differences.  The degree  of paralysis, possible learning problems, and related health concerns that a baby with spina bifida may have cannot be accurately predicted until after birth.  We provided the patient with written patient resources from the [employer]   We discussed that in most cases, surgery to close the opening in the spine is performed within the first 24 to 48 hours after birth.  Immediate surgery is required to prevent infection and any further damage to the nerves that are exposed.  We also discussed the option of in utero NTD repair, which is available at several centers throughout the country. Fetal surgery for ONTDs has been shown to reduce the need for VP shunting and has demonstrated to improve mobility in some cases. We spent some time discussing the eligibility criteria for in utero repair and briefly discussed risks versus benefits. Fetal surgery is typically performed between 24 and [redacted] weeks gestation, and we discussed that [facility] Ms. MRobergstated that she and the father of the pregnancy had read some information and discussed the option of in utero surgery for ONTDs prior to today's visit and had decided that they are not interested in pursuing this option.   Regarding postnatal repair, we discussed the option of transferring OB care to plan to deliver at a tertiary care center. We discussed the option meeting with a pediatric neurosurgeon to discuss and coordinate any necessary surgeries following the delivery and discussed the option of referring to various [place]. AJolleen Semanis interested in prenatal consultation with pediatric neurosurgery and [facility]  We will facilitate this appointment for the patient.   We discussed the many causes of NTDs.  They were informed that spina bifida occurs as an isolated finding, in the majority of cases.   "Isolated" simply means that spina bifida is the only birth difference that happened in the baby.  Isolated NTDs are usually inherited in a multifactorial manner in which there is no prior family history.  Multifactorial conditions have both environmental and genetic factors that contribute to their development.  Both the genetic and environmental factors that contribute to the development of spina bifida are largely unknown; however, some medications and health conditions, such as uncontrolled diabetes and obesity, may increase the chance of spina bifida.  We also discussed the role of folic acid in the development of the neural tube and prevention of spina bifida.  Mrs. Colene Perkovich reported that she was not taking any medications that are known to increase the risk of spina bifida.  We also discussed that NTDs may occur as a feature of an underlying genetic syndrome or condition.  Approximately 5-10% of individuals who have spina bifida also have an underlying chromosome condition.  Ms. Karne Ozga previously had NIPS (Panorama), which was within normal range for the conditions screened. She understands that this does not assess for all chromosome conditions and does not assess for single gene conditions. She had previously declined amniocentesis in the pregnancy.  Follow-up ultrasound was scheduled for 04/13/17.   I counseled Ms. Alainna Gamel regarding the above risks and available options.  The approximate face-to-face time with the genetic counselor was 20 minutes.  Chipper Oman, MS Certified Genetic Counselor 03/16/2017

## 2017-03-17 ENCOUNTER — Other Ambulatory Visit (HOSPITAL_COMMUNITY): Payer: Self-pay | Admitting: Obstetrics and Gynecology

## 2017-03-17 DIAGNOSIS — O359XX0 Maternal care for (suspected) fetal abnormality and damage, unspecified, not applicable or unspecified: Secondary | ICD-10-CM

## 2017-03-17 DIAGNOSIS — Z362 Encounter for other antenatal screening follow-up: Secondary | ICD-10-CM

## 2017-03-17 DIAGNOSIS — O99212 Obesity complicating pregnancy, second trimester: Secondary | ICD-10-CM

## 2017-03-17 DIAGNOSIS — Z3A22 22 weeks gestation of pregnancy: Secondary | ICD-10-CM

## 2017-03-20 ENCOUNTER — Other Ambulatory Visit: Payer: Self-pay

## 2017-03-27 ENCOUNTER — Ambulatory Visit (INDEPENDENT_AMBULATORY_CARE_PROVIDER_SITE_OTHER): Payer: Medicaid Other | Admitting: Advanced Practice Midwife

## 2017-03-27 VITALS — BP 137/83 | HR 108 | Wt 267.0 lb

## 2017-03-27 DIAGNOSIS — O09892 Supervision of other high risk pregnancies, second trimester: Secondary | ICD-10-CM

## 2017-03-27 DIAGNOSIS — O283 Abnormal ultrasonic finding on antenatal screening of mother: Secondary | ICD-10-CM

## 2017-03-27 DIAGNOSIS — O350XX0 Maternal care for (suspected) central nervous system malformation in fetus, not applicable or unspecified: Secondary | ICD-10-CM

## 2017-03-27 DIAGNOSIS — O09899 Supervision of other high risk pregnancies, unspecified trimester: Secondary | ICD-10-CM

## 2017-03-27 DIAGNOSIS — O162 Unspecified maternal hypertension, second trimester: Secondary | ICD-10-CM | POA: Diagnosis not present

## 2017-03-27 DIAGNOSIS — O358XX0 Maternal care for other (suspected) fetal abnormality and damage, not applicable or unspecified: Secondary | ICD-10-CM | POA: Diagnosis not present

## 2017-03-27 DIAGNOSIS — Z789 Other specified health status: Secondary | ICD-10-CM

## 2017-03-27 DIAGNOSIS — O35BXX0 Maternal care for other (suspected) fetal abnormality and damage, fetal cardiac anomalies, not applicable or unspecified: Secondary | ICD-10-CM

## 2017-03-27 DIAGNOSIS — O3508X0 Maternal care for (suspected) central nervous system malformation or damage in fetus, spina bifida, not applicable or unspecified: Secondary | ICD-10-CM

## 2017-03-27 MED ORDER — BREAST PUMP MISC: 0 refills | Status: DC

## 2017-03-27 NOTE — Progress Notes (Signed)
   PRENATAL VISIT NOTE  Subjective:  Katrina Perez is a 26 y.o. G1P0 at 221w0d being seen today for ongoing prenatal care.  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  Patient reports no complaints.  Contractions: Not present. Vag. Bleeding: None.  Movement: Present. Denies leaking of fluid.   The following portions of the patient's history were reviewed and updated as appropriate: allergies, current medications, past family history, past medical history, past social history, past surgical history and problem list. Problem list updated.  Objective:   Vitals:   03/27/17 0914  BP: 137/83  Pulse: (!) 108  Weight: 267 lb (121.1 kg)    Fetal Status: Fetal Heart Rate (bpm): 147 Fundal Height: 27 cm Movement: Present     General:  Alert, oriented and cooperative. Patient is in no acute distress.  Skin: Skin is warm and dry. No rash noted.   Cardiovascular: Normal heart rate noted  Respiratory: Normal respiratory effort, no problems with respiration noted  Abdomen: Soft, gravid, appropriate for gestational age. Pain/Pressure: Absent     Pelvic:  Cervical exam deferred        Extremities: Normal range of motion.  Edema: None  Mental Status: Normal mood and affect. Normal behavior. Normal judgment and thought content.   Assessment and Plan:  Pregnancy: G1P0 at 581w0d  1. Exclusively breastfeed infant - Discussed pumping since baby will likely need surgery soon after birth. Encouraged BF class, - Misc. Devices (BREAST PUMP) MISC; Dispense one breast pump for patient  Dispense: 1 each; Refill: 0  2.  Hypertension affecting pregnancy in second trimester - High-Nml today. Pre-E labs Nml at last visit. CTO closely. Pre-E precautions.   3. 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   4. Fetal cardiac echogenic focus, antepartum, single or unspecified fetus - Has Repeat Echo scheduled at 28 weeks.   5. Supervision of other high risk pregnancy, antepartum - [facility] Pt asking who she will deliver with and how that process will happen at time of delivery. Discussed [facility]. Pt may be interested. Will call back if she wants to be referred.   Preterm labor symptoms and general obstetric precautions including but not limited to vaginal bleeding, contractions, leaking of fluid and fetal movement were reviewed in detail with the patient. Please refer to After Visit Summary for other counseling recommendations.  Return in about 2 weeks (around 04/10/2017) for ROB/GTT.   Katrinka BlazingSmith, [place], CNM

## 2017-03-27 NOTE — Patient Instructions (Signed)
Breast Pumping Tips °If you are breastfeeding, there may be times when you cannot feed your baby directly. Returning to work or going on a trip are common examples. Pumping allows you to store breast milk and feed it to your baby later. °You may not get much milk when you first start to pump. Your breasts should start to make more after a few days. If you pump at the times you usually feed your baby, you may be able to keep making enough milk to feed your baby without also using formula. The more often you pump, the more milk you will produce. °When should I pump? °· You can begin to pump soon after delivery. However, some experts recommend waiting about 4 weeks before giving your infant a bottle to make sure breastfeeding is going well. °· If you plan to return to work, begin pumping a few weeks before. This will help you develop techniques that work best for you. It also lets you build up a supply of breast milk. °· When you are with your infant, feed on demand and pump after each feeding. °· When you are away from your infant for several hours, pump for about 15 minutes every 2-3 hours. Pump both breasts at the same time if you can. °· If your infant has a formula feeding, make sure to pump around the same time. °· If you drink any alcohol, wait 2 hours before pumping. °How do I prepare to pump? °Your let-down reflex is the natural reaction to stimulation that makes your breast milk flow. It is easier to stimulate this reflex when you are relaxed. Find relaxation techniques that work for you. If you have difficulty with your let-down reflex, try these methods: °· Smell one of your infant's blankets or an item of clothing. °· Look at a picture or video of your infant. °· Sit in a quiet, private space. °· Massage the breast you plan to pump. °· Place soothing warmth on the breast. °· Play relaxing music. ° °What are some general breast pumping tips? °· Wash your hands before you pump. You do not need to wash your  nipples or breasts. °· There are three ways to pump. °? You can use your hand to massage and compress your breast. °? You can use a handheld manual pump. °? You can use an electric pump. °· Make sure the suction cup (flange) on the breast pump is the right size. Place the flange directly over the nipple. If it is the wrong size or placed the wrong way, it may be painful and cause nipple damage. °· If pumping is uncomfortable, apply a small amount of purified or modified lanolin to your nipple and areola. °· If you are using an electric pump, adjust the speed and suction power to be more comfortable. °· If pumping is painful or if you are not getting very much milk, you may need a different type of pump. A lactation consultant can help you determine what type of pump to use. °· Keep a full water bottle near you at all times. Drinking lots of fluid helps you make more milk. °· You can store your milk to use later. Pumped breast milk can be stored in a sealable, sterile container or plastic bag. Label all stored breast milk with the date you pumped it. °? Milk can stay out at room temperature for up to 8 hours. °? You can store your milk in the refrigerator for up to 8 days. °? You can   store your milk in the freezer for 3 months. Thaw frozen milk using warm water. Do not put it in the microwave. °· Do not smoke. Smoking can lower your milk supply and harm your infant. If you need help quitting, ask your health care provider to recommend a program. °When should I call my health care provider or a lactation consultant? °· You are having trouble pumping. °· You are concerned that you are not making enough milk. °· You have nipple pain, soreness, or redness. °· You want to use birth control. Birth control pills may lower your milk supply. Talk to your health care provider about your options. °This information is not intended to replace advice given to you by your health care provider. Make sure you discuss any questions  you have with your health care provider. °Document Released: 04/27/2010 Document Revised: 04/20/2016 Document Reviewed: 08/30/2013 °Elsevier Interactive Patient Education © 2017 Elsevier Inc. ° °

## 2017-03-29 ENCOUNTER — Encounter: Payer: Self-pay | Admitting: Advanced Practice Midwife

## 2017-04-10 ENCOUNTER — Ambulatory Visit (INDEPENDENT_AMBULATORY_CARE_PROVIDER_SITE_OTHER): Payer: Medicaid Other | Admitting: Certified Nurse Midwife

## 2017-04-10 ENCOUNTER — Encounter: Payer: Self-pay | Admitting: Certified Nurse Midwife

## 2017-04-10 VITALS — BP 128/68 | Wt 268.0 lb

## 2017-04-10 DIAGNOSIS — O35BXX0 Maternal care for other (suspected) fetal abnormality and damage, fetal cardiac anomalies, not applicable or unspecified: Secondary | ICD-10-CM

## 2017-04-10 DIAGNOSIS — Z23 Encounter for immunization: Secondary | ICD-10-CM | POA: Diagnosis not present

## 2017-04-10 DIAGNOSIS — Z3492 Encounter for supervision of normal pregnancy, unspecified, second trimester: Secondary | ICD-10-CM

## 2017-04-10 DIAGNOSIS — O358XX0 Maternal care for other (suspected) fetal abnormality and damage, not applicable or unspecified: Secondary | ICD-10-CM

## 2017-04-10 DIAGNOSIS — Z3490 Encounter for supervision of normal pregnancy, unspecified, unspecified trimester: Secondary | ICD-10-CM

## 2017-04-10 DIAGNOSIS — O350XX0 Maternal care for (suspected) central nervous system malformation in fetus, not applicable or unspecified: Secondary | ICD-10-CM

## 2017-04-10 DIAGNOSIS — O09899 Supervision of other high risk pregnancies, unspecified trimester: Secondary | ICD-10-CM

## 2017-04-10 DIAGNOSIS — O3508X0 Maternal care for (suspected) central nervous system malformation or damage in fetus, spina bifida, not applicable or unspecified: Secondary | ICD-10-CM

## 2017-04-10 LAB — CBC
HCT: 32.2 % — ABNORMAL LOW (ref 35.0–45.0)
HEMOGLOBIN: 10.7 g/dL — AB (ref 11.7–15.5)
MCH: 29.7 pg (ref 27.0–33.0)
MCHC: 33.2 g/dL (ref 32.0–36.0)
MCV: 89.4 fL (ref 80.0–100.0)
MPV: 9 fL (ref 7.5–12.5)
PLATELETS: 322 10*3/uL (ref 140–400)
RBC: 3.6 MIL/uL — ABNORMAL LOW (ref 3.80–5.10)
RDW: 14.1 % (ref 11.0–15.0)
WBC: 12 10*3/uL — AB (ref 3.8–10.8)

## 2017-04-10 NOTE — Progress Notes (Signed)
Subjective:  Katrina Perez is a 26 y.o. G1P0 at 3129w0d being seen today for ongoing prenatal care.  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  Patient reports no complaints.  Contractions: Not present. Vag. Bleeding: None.  Movement: Present. Denies leaking of fluid.   The following portions of the patient's history were reviewed and updated as appropriate: allergies, current medications, past family history, past medical history, past social history, past surgical history and problem list. Problem list updated.  Objective:   Vitals:   04/10/17 0807  BP: 128/68  Weight: 268 lb (121.6 kg)    Fetal Status: Fetal Heart Rate (bpm): 148 Fundal Height: 29 cm Movement: Present  Presentation: Undeterminable  General:  Alert, oriented and cooperative. Patient is in no acute distress.  Skin: Skin is warm and dry. No rash noted.   Cardiovascular: Normal heart rate noted  Respiratory: Normal respiratory effort, no problems with respiration noted  Abdomen: Soft, gravid, appropriate for gestational age. Pain/Pressure: Absent     Pelvic: Vag. Bleeding: None Vag D/C Character: Thin   Cervical exam deferred        Extremities: Normal range of motion.  Edema: None  Mental Status: Normal mood and affect. Normal behavior. Normal judgment and thought content.   Urinalysis:      Assessment and Plan:  Pregnancy: G1P0 at 829w0d  1. Pregnancy, unspecified gestational age - Glucose Tolerance, 2 Hours w/1 Hour - HIV antibody (with reflex) - CBC - RPR - Tdap vaccine greater than or equal to 7yo IM  2. Fetal spina  bifida with myelomeningocele affecting management of mother - consult with peds Neuro completed - [facility] - pt prefers to transfer care in 3rd trimester, needs referral > info given to [place]   3. Fetal cardiac echogenic focus, antepartum, single or unspecified fetus - nml echo - f/u US this week  4. Supervision of other high risk pregnancy, antepartum - ONTD  Preterm labor symptoms and general obstetric precautions including but not limited to vaginal bleeding, contractions, leaking of fluid and fetal movement were reviewed in detail with the patient. Please refer to After Visit Summary for other counseling recommendations.  Return in about 2 weeks (around 04/24/2017).   Donette LarryBhambri, Veneda Kirksey, CNM

## 2017-04-11 LAB — GLUCOSE TOLERANCE, 2 HOURS W/ 1HR
GLUCOSE: 133 mg/dL
Glucose, 2 hour: 110 mg/dL (ref <140)
Glucose, Fasting: 78 mg/dL (ref 65–99)

## 2017-04-11 LAB — HIV ANTIBODY (ROUTINE TESTING W REFLEX): HIV: NONREACTIVE

## 2017-04-12 LAB — RPR

## 2017-04-13 ENCOUNTER — Other Ambulatory Visit (HOSPITAL_COMMUNITY): Payer: Self-pay | Admitting: Maternal and Fetal Medicine

## 2017-04-13 ENCOUNTER — Encounter (HOSPITAL_COMMUNITY): Payer: Self-pay

## 2017-04-13 ENCOUNTER — Ambulatory Visit (HOSPITAL_COMMUNITY)
Admission: RE | Admit: 2017-04-13 | Discharge: 2017-04-13 | Disposition: A | Discharge status: Home / Self Care | Payer: Medicaid Other | Source: Ambulatory Visit | Attending: Advanced Practice Midwife | Admitting: Advanced Practice Midwife

## 2017-04-13 DIAGNOSIS — O359XX0 Maternal care for (suspected) fetal abnormality and damage, unspecified, not applicable or unspecified: Secondary | ICD-10-CM | POA: Diagnosis not present

## 2017-04-13 DIAGNOSIS — O99212 Obesity complicating pregnancy, second trimester: Secondary | ICD-10-CM | POA: Diagnosis not present

## 2017-04-13 DIAGNOSIS — Z362 Encounter for other antenatal screening follow-up: Secondary | ICD-10-CM | POA: Diagnosis not present

## 2017-04-13 DIAGNOSIS — Z3A26 26 weeks gestation of pregnancy: Secondary | ICD-10-CM | POA: Insufficient documentation

## 2017-04-14 ENCOUNTER — Other Ambulatory Visit (HOSPITAL_COMMUNITY): Payer: Self-pay | Admitting: *Deleted

## 2017-04-14 DIAGNOSIS — O3500X0 Maternal care for (suspected) central nervous system malformation or damage in fetus, unspecified, not applicable or unspecified: Secondary | ICD-10-CM

## 2017-04-14 DIAGNOSIS — O350XX0 Maternal care for (suspected) central nervous system malformation in fetus, not applicable or unspecified: Secondary | ICD-10-CM

## 2017-04-24 ENCOUNTER — Encounter: Payer: Medicaid Other | Admitting: Advanced Practice Midwife

## 2017-05-11 ENCOUNTER — Other Ambulatory Visit (HOSPITAL_COMMUNITY): Payer: Self-pay | Admitting: Obstetrics and Gynecology

## 2017-05-11 ENCOUNTER — Ambulatory Visit (HOSPITAL_COMMUNITY)
Admission: RE | Admit: 2017-05-11 | Discharge: 2017-05-11 | Disposition: A | Discharge status: Home / Self Care | Payer: Medicaid Other | Source: Ambulatory Visit | Attending: Advanced Practice Midwife | Admitting: Advanced Practice Midwife

## 2017-05-11 ENCOUNTER — Encounter (HOSPITAL_COMMUNITY): Payer: Self-pay

## 2017-05-11 DIAGNOSIS — O3500X0 Maternal care for (suspected) central nervous system malformation or damage in fetus, unspecified, not applicable or unspecified: Secondary | ICD-10-CM

## 2017-05-11 DIAGNOSIS — Z3A30 30 weeks gestation of pregnancy: Secondary | ICD-10-CM | POA: Diagnosis not present

## 2017-05-11 DIAGNOSIS — O99213 Obesity complicating pregnancy, third trimester: Secondary | ICD-10-CM

## 2017-05-11 DIAGNOSIS — G9389 Other specified disorders of brain: Secondary | ICD-10-CM | POA: Insufficient documentation

## 2017-05-11 DIAGNOSIS — O283 Abnormal ultrasonic finding on antenatal screening of mother: Secondary | ICD-10-CM

## 2017-05-11 DIAGNOSIS — O0930 Supervision of pregnancy with insufficient antenatal care, unspecified trimester: Secondary | ICD-10-CM

## 2017-05-11 DIAGNOSIS — O359XX0 Maternal care for (suspected) fetal abnormality and damage, unspecified, not applicable or unspecified: Secondary | ICD-10-CM

## 2017-05-11 DIAGNOSIS — O350XX0 Maternal care for (suspected) central nervous system malformation in fetus, not applicable or unspecified: Secondary | ICD-10-CM

## 2017-05-11 DIAGNOSIS — O35BXX0 Maternal care for other (suspected) fetal abnormality and damage, fetal cardiac anomalies, not applicable or unspecified: Secondary | ICD-10-CM

## 2017-05-11 DIAGNOSIS — O3508X0 Maternal care for (suspected) central nervous system malformation or damage in fetus, spina bifida, not applicable or unspecified: Secondary | ICD-10-CM

## 2017-05-11 DIAGNOSIS — O358XX0 Maternal care for other (suspected) fetal abnormality and damage, not applicable or unspecified: Secondary | ICD-10-CM

## 2017-05-11 DIAGNOSIS — Q0701 Arnold-Chiari syndrome with spina bifida: Secondary | ICD-10-CM | POA: Diagnosis not present

## 2017-05-11 DIAGNOSIS — O09899 Supervision of other high risk pregnancies, unspecified trimester: Secondary | ICD-10-CM

## 2017-05-11 DIAGNOSIS — R772 Abnormality of alphafetoprotein: Secondary | ICD-10-CM

## 2017-05-11 DIAGNOSIS — O219 Vomiting of pregnancy, unspecified: Secondary | ICD-10-CM

## 2017-05-11 DIAGNOSIS — O162 Unspecified maternal hypertension, second trimester: Secondary | ICD-10-CM

## 2017-05-11 LAB — COMPREHENSIVE METABOLIC PANEL
ALBUMIN: 3 g/dL — AB (ref 3.5–5.0)
ALT: 11 U/L — AB (ref 14–54)
AST: 16 U/L (ref 15–41)
Alkaline Phosphatase: 89 U/L (ref 38–126)
Anion gap: 8 (ref 5–15)
BUN: 6 mg/dL (ref 6–20)
CHLORIDE: 107 mmol/L (ref 101–111)
CO2: 22 mmol/L (ref 22–32)
CREATININE: 0.35 mg/dL — AB (ref 0.44–1.00)
Calcium: 9.2 mg/dL (ref 8.9–10.3)
GFR calc Af Amer: >60 mL/min (ref >60)
GFR calc non Af Amer: >60 mL/min (ref >60)
Glucose, Bld: 88 mg/dL (ref 65–99)
POTASSIUM: 3.8 mmol/L (ref 3.5–5.1)
Sodium: 137 mmol/L (ref 135–145)
Total Bilirubin: 0.2 mg/dL — ABNORMAL LOW (ref 0.3–1.2)
Total Protein: 6.4 g/dL — ABNORMAL LOW (ref 6.5–8.1)

## 2017-05-11 LAB — CBC
HEMATOCRIT: 32.2 % — AB (ref 36.0–46.0)
Hemoglobin: 10.9 g/dL — ABNORMAL LOW (ref 12.0–15.0)
MCH: 29.5 pg (ref 26.0–34.0)
MCHC: 33.9 g/dL (ref 30.0–36.0)
MCV: 87.3 fL (ref 78.0–100.0)
PLATELETS: 301 10*3/uL (ref 150–400)
RBC: 3.69 MIL/uL — ABNORMAL LOW (ref 3.87–5.11)
RDW: 13.9 % (ref 11.5–15.5)
WBC: 10.2 10*3/uL (ref 4.0–10.5)

## 2017-05-11 NOTE — ED Notes (Signed)
Pt here today in [facility].  [facility] last week to R/O contractions.  Pt states she is on her feet with work all day and experiences tightening in upper abd towards the end of the day.  C/O inter RUQ tightening but goes away with rest.  BP today in left arm, 149/81, Rt 140/93.  Pt denies HA, visual changes, or any edema.  Good fetal movement.  Pt to see primary OB today.

## 2017-06-07 ENCOUNTER — Other Ambulatory Visit (HOSPITAL_COMMUNITY): Payer: Self-pay

## 2017-06-16 ENCOUNTER — Inpatient Hospital Stay (HOSPITAL_COMMUNITY): Payer: Medicaid Other

## 2017-06-16 ENCOUNTER — Inpatient Hospital Stay (HOSPITAL_COMMUNITY)
Admission: AD | Admit: 2017-06-16 | Discharge: 2017-06-16 | Disposition: A | Discharge status: Home / Self Care | Payer: Medicaid Other | Source: Ambulatory Visit | Attending: Obstetrics and Gynecology | Admitting: Obstetrics and Gynecology

## 2017-06-16 ENCOUNTER — Encounter (HOSPITAL_COMMUNITY): Payer: Self-pay

## 2017-06-16 DIAGNOSIS — T1490XA Injury, unspecified, initial encounter: Secondary | ICD-10-CM | POA: Diagnosis present

## 2017-06-16 DIAGNOSIS — Z882 Allergy status to sulfonamides status: Secondary | ICD-10-CM | POA: Diagnosis not present

## 2017-06-16 DIAGNOSIS — Z7982 Long term (current) use of aspirin: Secondary | ICD-10-CM | POA: Insufficient documentation

## 2017-06-16 DIAGNOSIS — W01198A Fall on same level from slipping, tripping and stumbling with subsequent striking against other object, initial encounter: Secondary | ICD-10-CM | POA: Insufficient documentation

## 2017-06-16 DIAGNOSIS — O9989 Other specified diseases and conditions complicating pregnancy, childbirth and the puerperium: Secondary | ICD-10-CM

## 2017-06-16 DIAGNOSIS — Z79899 Other long term (current) drug therapy: Secondary | ICD-10-CM | POA: Diagnosis not present

## 2017-06-16 DIAGNOSIS — O26893 Other specified pregnancy related conditions, third trimester: Secondary | ICD-10-CM | POA: Diagnosis not present

## 2017-06-16 DIAGNOSIS — O9A213 Injury, poisoning and certain other consequences of external causes complicating pregnancy, third trimester: Secondary | ICD-10-CM

## 2017-06-16 DIAGNOSIS — Z3A35 35 weeks gestation of pregnancy: Secondary | ICD-10-CM | POA: Insufficient documentation

## 2017-06-16 NOTE — Discharge Instructions (Signed)
Preventing Injuries During Pregnancy °Trauma is the most common cause of injury and death in pregnant women. This can also result in serious harm to the baby or even death. °How can injuries affect my pregnancy? °Your baby is protected in the womb (uterus) by a sac filled with fluid (amniotic sac). Your baby can be harmed if there is a direct blow to your abdomen and pelvis. Trauma may be caused by: °· Falls. These are more common in the second and third trimester of pregnancy. °· Automobile accidents. °· Domestic violence or assault. °· Severe burns, such as from fire or electricity. ° °These injuries can result in: °· Tearing of your uterus. °· The placenta pulling away from the wall of the uterus (placental abruption). °· The amniotic sac breaking open (rupture of membranes). °· Blockage or decrease in the blood supply to your baby. °· Going into labor earlier than expected. °· Severe injuries to other parts of your body, such as your brain, spine, heart, lungs, or other organs. ° °Minor falls and low-impact automobile accidents do not usually harm your baby, even if they cause a little harm to you. °What can I do to lower my risk? °Safety °· Remove slippery rugs and loose objects on the floor. They increase your risk of tripping or slipping. °· Wear comfortable shoes that have a good grip on the sole. Do not wear high-heeled shoes. °· Always wear your seat belt properly when riding in a car. Use both the lap and shoulder belt, with the lap belt below your abdomen. Always practice safe driving. Do not ride on a motorcycle while pregnant. °Activity °· Avoid walking on wet or slippery floors. °· Do not participate in rough and violent activities or sports. °· Avoid high-risk situations and activities such as: °? Lifting heavy pots of boiling or hot liquids. °? Fixing electrical problems. °? Being near fires or starting fires. °General instructions °· Take over-the-counter and prescription medicines only as told by  your health care provider. °· Know your blood type and the father's blood type in case you develop vaginal bleeding or experience an injury for which a blood transfusion is needed. °· Spousal abuse can be a serious cause of trauma during pregnancy. If you are a victim of domestic violence or assault: °? Call your local emergency services (911 in the [place] °? Contact the National Domestic Violence Hotline for help and support. °When should I seek immediate medical care? °Get help right away if: °· You fall on your abdomen or experience any serious blow to your abdomen. °· You develop stiffness in your neck or pain after a fall or from other trauma. °· You develop a headache or vision problems after a fall or from other trauma. °· You do not feel the baby moving after a fall or trauma, or you feel that the baby is not moving as much as before the fall or trauma. °· You have been the victim of domestic violence or any other kind of physical attack. °· You have been in a car accident. °· You develop vaginal bleeding. °· You have fluid leaking from the vagina. °· You develop uterine contractions. Symptoms include pelvic cramping, pain, or serious low back pain. °· You become weak, faint, or have uncontrolled vomiting after trauma. °· You have a serious burn. This includes burns to the face, neck, hands, or genitals, or burns greater than the size of your palm anywhere else. ° °Summary °· Trauma is the most common cause of   injury and death in pregnant women and can also lead to injury or death of the baby. °· Falls, automobile accidents, domestic violence or assault, and severe burns can injure you or your baby. Make sure to get medical help right away if you experience any of these during your pregnancy. °· Take steps to prevent slips or falls in your home, such as avoiding slippery floors and removing loose rugs. °· Always wear your seat belt properly when riding in a car. Practice safe driving. °This information is  not intended to replace advice given to you by your health care provider. Make sure you discuss any questions you have with your health care provider. °Document Released: 12/15/2004 Document Revised: 11/16/2016 Document Reviewed: 11/16/2016 °Elsevier Interactive Patient Education © 2017 [employer] ° °

## 2017-06-16 NOTE — MAU Note (Signed)
Urine in lab 

## 2017-06-16 NOTE — MAU Note (Signed)
G1 @ 35.[redacted] wksga. In triage due to a fall while walking in the neighborhood. States fell flat on her belly. [facility] and [facility] was told to come here instead. Denies bleeding or LOF. States had "one big ctx right after fall" none since. VSS see flow sheet for details.

## 2017-06-16 NOTE — MAU Provider Note (Signed)
History     CSN: [id number]  Arrival date and time: 06/16/17 1256  First Provider Initiated Contact with Patient 06/16/17 1330      Chief Complaint  Patient presents with  . Fall    landed on belly    HPI Katrina Nasutilyssa Areola is a 26 y.o. G1P0 at 4423w4d who presents s/p fall. Fall occurred at noon today. Patient was walking & tripped over a cracked sidewalk. States she fell harder & faster than she anticipated & was unable to put down her hands in time so abdomen took brunt of the fall. Immediately after fall she reports feeling a contraction that lasted 3 minutes, but denies contractions or abdominal pain since then. Denies vaginal bleeding, or LOF. Positive fetal movement.  OB History    Gravida Para Term Preterm AB Living   1         0   SAB TAB Ectopic Multiple Live Births                  Past Medical History:  Diagnosis Date  . Bilateral ankle fractures   . Frequent headaches    tension headaches per patient  . MRSA (methicillin resistant staph aureus) culture positive    bilater armpits    Past Surgical History:  Procedure Laterality Date  . left hip surgery     "replace hip in socket"    Family History  Problem Relation Age of Onset  . Hypertension Unknown        grandparents on both sides  . Heart attack Maternal Grandfather   . Hyperlipidemia Maternal Grandfather   . Diabetes Maternal Grandfather   . Colon cancer Unknown        paternal great grandfather  . Depression Mother        and mother's side of family  . Bipolar disorder Mother        and mother's side of family  . Anxiety disorder Mother        and mother's side of family  . Diabetes Maternal Aunt     Social History  Substance Use Topics  . Smoking status: Never Smoker  . Smokeless tobacco: Never Used  . Alcohol use Yes     Comment: Socially     Allergies:  Allergies  Allergen Reactions  . Bactrim [Sulfamethoxazole-Trimethoprim] Hives  . Latex Hives    Prescriptions Prior to Admission   Medication Sig Dispense Refill Last Dose  . aspirin EC 81 MG tablet Take 1 tablet (81 mg total) by mouth daily. Take 12 to 36 weeks for prevention of preeclampssia later in pregnancy 300 tablet 2 Taking  . Doxylamine-Pyridoxine (DICLEGIS) 10-10 MG TBEC Take 2 tablets by mouth at bedtime. If symptoms persist, add 1 tab in AM starting on day 3. If needed, add 1 tab in PM starting day 4. 100 tablet 5 Taking  . Misc. Devices (BREAST PUMP) MISC Dispense one breast pump for patient (Patient not taking: Reported on 04/10/2017) 1 each 0 Not Taking  . Prenatal Vit-Fe Fumarate-FA (PRENATAL VITAMIN PO) Take by mouth.   Taking    Review of Systems  Constitutional: Negative.   Gastrointestinal: Negative.   Genitourinary: Negative.   Skin: Positive for wound.   Physical Exam   Blood pressure 117/64, pulse 96, temperature 97.6 F (36.4 C), temperature source Oral, resp. rate 18, last menstrual period 10/10/2016, SpO2 100 %.  Physical Exam  Nursing note and vitals reviewed. Constitutional: She is oriented to person, place, and time. She  appears well-developed and well-nourished. No distress.  HENT:  Head: Normocephalic and atraumatic.  Eyes: Conjunctivae are normal. Right eye exhibits no discharge. Left eye exhibits no discharge. No scleral icterus.  Neck: Normal range of motion.  Cardiovascular: Normal rate, regular rhythm and normal heart sounds.   No murmur heard. Respiratory: Effort normal and breath sounds normal. No respiratory distress. She has no wheezes.  GI: Soft. Bowel sounds are normal.  Abdomen soft & non tender; no bruising  Neurological: She is alert and oriented to person, place, and time.  Skin: Skin is warm and dry. Abrasion (abrasions noted on left palm; clean, no bleeding. Bangage & neosporing applied) noted. She is not diaphoretic.  Psychiatric: She has a normal mood and affect. Her behavior is normal. Judgment and thought content normal.   Fetal Tracing:  Baseline:  135 Variability: moderate Accelerations: 15x15 Decelerations: none MAU Course  Procedures No results found for this or any previous visit (from the past 24 hour(s)). [place] Mfm Ob Limited  Result Date: 06/16/2017 ----------------------------------------------------------------------  OBSTETRICS REPORT                      (Signed Final 06/16/2017 02:25 pm) ---------------------------------------------------------------------- Patient Info  ID #:       [id number]                         D.O.B.:   Jun 23, 1991 (25 yrs)  Name:       Katrina Perez                 Visit Date:  06/16/2017 01:52 pm ---------------------------------------------------------------------- Performed By  Performed By:     Tommi Emery         Secondary Phy.:   [facility]                    [facility]  Attending:        Candis Shine        Address:          [address]                    [place]                                                             [id number]                                                             [place]  Referred By:      MAU Nursing-           Location:         [facility]                    MAU/Triage ---------------------------------------------------------------------- Orders   #  Description                                 Code   1  [place] MFM  OB LIMITED                           [id number]  ----------------------------------------------------------------------   #  Ordered By               Order #        Accession #    Episode #   1  Judeth HornERIN Brittini Brubeck            [id number]      [id number]     [id number]  ---------------------------------------------------------------------- Indications   [redacted] weeks gestation of pregnancy                Z3A.35   Fetal abnormality - other known or             O35.9XX0   suspected; ONTD with A-C malformation,   type 2 and ventriculomegaly; low risk NIPS;   normal ECHO x 2   Traumatic injury during pregnancy (pt fell on  O9A.219 T14.90   abdomen)   ---------------------------------------------------------------------- OB History  Blood Type:            Height:  5'8"   Weight (lb):  270      BMI:   41.05  Gravidity:    1         Term:   0        Prem:   0        SAB:   0  TOP:          0       Ectopic:  0        Living: 0 ---------------------------------------------------------------------- Fetal Evaluation  Num Of Fetuses:     1  Fetal Heart         153  Rate(bpm):  Cardiac Activity:   Observed  Presentation:       Cephalic  Placenta:           Left posterior, above cervical os  P. Cord Insertion:  Previously Visualized  Amniotic Fluid  AFI FV:      Subjectively within normal limits  AFI Sum(cm)     %Tile       Largest Pocket(cm)  17.55           65          6.56  RUQ(cm)       RLQ(cm)       LUQ(cm)        LLQ(cm)  6.56          4.9           3.84           2.25 ---------------------------------------------------------------------- Gestational Age  LMP:           35w 4d       Date:   10/10/16                 EDD:   07/17/17  Best:          35w 4d    Det. By:   LMP  (10/10/16)          EDD:   07/17/17 ---------------------------------------------------------------------- Cervix Uterus Adnexa  Cervix  Not visualized (advanced GA >29wks)  Uterus  No abnormality visualized.  Left Ovary  Not visualized. No adnexal mass visualized.  Right Ovary  Not visualized. No adnexal mass visualized. ---------------------------------------------------------------------- Impression  Single IUP at 35w 4d  Fetus with known myelomenigocele / ventriculomegaly  Limited ultrasound performed s/p fall  Cephalic presentation  Left posterior placenta without previa  No sonographic findings suggestive of abruption noted  Normal amniotic fluid volume ---------------------------------------------------------------------- Recommendations  Follow-up ultrasounds as clinically indicated. ----------------------------------------------------------------------                Candis Shine, MD  Electronically Signed Final Report   06/16/2017 02:25 pm ----------------------------------------------------------------------   MDM Abdomen soft & non tender, no bruising evident. Discussed with patient plan to monitor for 4 hours. Limited ultrasound obtained; no evidence of abruption.  After 4+ hours of fetal monitoring; no regular ctx & fetal tracing reactive. Reviewed patient with Dr. Emelda Fear who agrees patient can be discharged home.   Assessment and Plan  A; 1. Traumatic injury during pregnancy in third trimester   2. [redacted] weeks gestation of pregnancy    P: Discharge home Discussed reasons to return to MAU Keep f/u with OB  Judeth Horn 06/16/2017, 1:29 PM

## 2017-06-30 ENCOUNTER — Telehealth: Payer: Self-pay | Admitting: *Deleted

## 2017-06-30 ENCOUNTER — Encounter (HOSPITAL_COMMUNITY): Payer: Self-pay

## 2017-06-30 ENCOUNTER — Inpatient Hospital Stay (HOSPITAL_COMMUNITY)
Admission: AD | Admit: 2017-06-30 | Discharge: 2017-06-30 | Disposition: A | Discharge status: Home / Self Care | Payer: Medicaid Other | Source: Ambulatory Visit | Attending: Family Medicine | Admitting: Family Medicine

## 2017-06-30 DIAGNOSIS — O3508X0 Maternal care for (suspected) central nervous system malformation or damage in fetus, spina bifida, not applicable or unspecified: Secondary | ICD-10-CM

## 2017-06-30 DIAGNOSIS — O35BXX0 Maternal care for other (suspected) fetal abnormality and damage, fetal cardiac anomalies, not applicable or unspecified: Secondary | ICD-10-CM

## 2017-06-30 DIAGNOSIS — O479 False labor, unspecified: Secondary | ICD-10-CM | POA: Diagnosis not present

## 2017-06-30 DIAGNOSIS — O350XX0 Maternal care for (suspected) central nervous system malformation in fetus, not applicable or unspecified: Secondary | ICD-10-CM

## 2017-06-30 DIAGNOSIS — Z3A00 Weeks of gestation of pregnancy not specified: Secondary | ICD-10-CM | POA: Insufficient documentation

## 2017-06-30 DIAGNOSIS — O283 Abnormal ultrasonic finding on antenatal screening of mother: Secondary | ICD-10-CM

## 2017-06-30 DIAGNOSIS — O219 Vomiting of pregnancy, unspecified: Secondary | ICD-10-CM

## 2017-06-30 DIAGNOSIS — O09899 Supervision of other high risk pregnancies, unspecified trimester: Secondary | ICD-10-CM

## 2017-06-30 DIAGNOSIS — O163 Unspecified maternal hypertension, third trimester: Secondary | ICD-10-CM

## 2017-06-30 DIAGNOSIS — O358XX0 Maternal care for other (suspected) fetal abnormality and damage, not applicable or unspecified: Secondary | ICD-10-CM

## 2017-06-30 DIAGNOSIS — O0930 Supervision of pregnancy with insufficient antenatal care, unspecified trimester: Secondary | ICD-10-CM

## 2017-06-30 DIAGNOSIS — R772 Abnormality of alphafetoprotein: Secondary | ICD-10-CM

## 2017-06-30 LAB — URINALYSIS, ROUTINE W REFLEX MICROSCOPIC
BILIRUBIN URINE: NEGATIVE
Glucose, UA: NEGATIVE mg/dL
HGB URINE DIPSTICK: NEGATIVE
KETONES UR: NEGATIVE mg/dL
NITRITE: NEGATIVE
PROTEIN: NEGATIVE mg/dL
Specific Gravity, Urine: 1.018 (ref 1.005–1.030)
pH: 6 (ref 5.0–8.0)

## 2017-06-30 NOTE — Telephone Encounter (Signed)
Pharmacy called, would like an update on status of prior authorization for diclegis. Please return call.

## 2017-06-30 NOTE — MAU Note (Addendum)
Ever since this morning, started was having int. Tightening, then it stopped.  While at work, she started having contractions and pelvic pain.  Called ob and was told to come here, since she was closer. (plan is for c/s on 8/20- due to baby status). ? Leaking- doesn't know if she is peeing on herself or leaking.

## 2017-06-30 NOTE — Discharge Instructions (Signed)
Third Trimester of Pregnancy The third trimester is from week 28 through week 40 (months 7 through 9). The third trimester is a time when the unborn baby (fetus) is growing rapidly. At the end of the ninth month, the fetus is about 20 inches in length and weighs 6-10 pounds. Body changes during your third trimester Your body will continue to go through many changes during pregnancy. The changes vary from woman to woman. During the third trimester:  Your weight will continue to increase. You can expect to gain 25-35 pounds (11-16 kg) by the end of the pregnancy.  You may begin to get stretch marks on your hips, abdomen, and breasts.  You may urinate more often because the fetus is moving lower into your pelvis and pressing on your bladder.  You may develop or continue to have heartburn. This is caused by increased hormones that slow down muscles in the digestive tract.  You may develop or continue to have constipation because increased hormones slow digestion and cause the muscles that push waste through your intestines to relax.  You may develop hemorrhoids. These are swollen veins (varicose veins) in the rectum that can itch or be painful.  You may develop swollen, bulging veins (varicose veins) in your legs.  You may have increased body aches in the pelvis, back, or thighs. This is due to weight gain and increased hormones that are relaxing your joints.  You may have changes in your hair. These can include thickening of your hair, rapid growth, and changes in texture. Some women also have hair loss during or after pregnancy, or hair that feels dry or thin. Your hair will most likely return to normal after your baby is born.  Your breasts will continue to grow and they will continue to become tender. A yellow fluid (colostrum) may leak from your breasts. This is the first milk you are producing for your baby.  Your belly button may stick out.  You may notice more swelling in your hands,  face, or ankles.  You may have increased tingling or numbness in your hands, arms, and legs. The skin on your belly may also feel numb.  You may feel short of breath because of your expanding uterus.  You may have more problems sleeping. This can be caused by the size of your belly, increased need to urinate, and an increase in your body's metabolism.  You may notice the fetus "dropping," or moving lower in your abdomen (lightening).  You may have increased vaginal discharge.  You may notice your joints feel loose and you may have pain around your pelvic bone.  What to expect at prenatal visits You will have prenatal exams every 2 weeks until week 36. Then you will have weekly prenatal exams. During a routine prenatal visit:  You will be weighed to make sure you and the baby are growing normally.  Your blood pressure will be taken.  Your abdomen will be measured to track your baby's growth.  The fetal heartbeat will be listened to.  Any test results from the previous visit will be discussed.  You may have a cervical check near your due date to see if your cervix has softened or thinned (effaced).  You will be tested for Group B streptococcus. This happens between 35 and 37 weeks.  Your health care provider may ask you:  What your birth plan is.  How you are feeling.  If you are feeling the baby move.  If you have had   any abnormal symptoms, such as leaking fluid, bleeding, severe headaches, or abdominal cramping.  If you are using any tobacco products, including cigarettes, chewing tobacco, and electronic cigarettes.  If you have any questions.  Other tests or screenings that may be performed during your third trimester include:  Blood tests that check for low iron levels (anemia).  Fetal testing to check the health, activity level, and growth of the fetus. Testing is done if you have certain medical conditions or if there are problems during the  pregnancy.  Nonstress test (NST). This test checks the health of your baby to make sure there are no signs of problems, such as the baby not getting enough oxygen. During this test, a belt is placed around your belly. The baby is made to move, and its heart rate is monitored during movement.  What is false labor? False labor is a condition in which you feel small, irregular tightenings of the muscles in the womb (contractions) that usually go away with rest, changing position, or drinking water. These are called Braxton Hicks contractions. Contractions may last for hours, days, or even weeks before true labor sets in. If contractions come at regular intervals, become more frequent, increase in intensity, or become painful, you should see your health care provider. What are the signs of labor?  Abdominal cramps.  Regular contractions that start at 10 minutes apart and become stronger and more frequent with time.  Contractions that start on the top of the uterus and spread down to the lower abdomen and back.  Increased pelvic pressure and dull back pain.  A watery or bloody mucus discharge that comes from the vagina.  Leaking of amniotic fluid. This is also known as your "water breaking." It could be a slow trickle or a gush. Let your health care provider know if it has a color or strange odor. If you have any of these signs, call your health care provider right away, even if it is before your due date. Follow these instructions at home: Medicines  Follow your health care provider's instructions regarding medicine use. Specific medicines may be either safe or unsafe to take during pregnancy.  Take a prenatal vitamin that contains at least 600 micrograms (mcg) of folic acid.  If you develop constipation, try taking a stool softener if your health care provider approves. Eating and drinking  Eat a balanced diet that includes fresh fruits and vegetables, whole grains, good sources of protein  such as meat, eggs, or tofu, and low-fat dairy. Your health care provider will help you determine the amount of weight gain that is right for you.  Avoid raw meat and uncooked cheese. These carry germs that can cause birth defects in the baby.  If you have low calcium intake from food, talk to your health care provider about whether you should take a daily calcium supplement.  Eat four or five small meals rather than three large meals a day.  Limit foods that are high in fat and processed sugars, such as fried and sweet foods.  To prevent constipation: ? Drink enough fluid to keep your urine clear or pale yellow. ? Eat foods that are high in fiber, such as fresh fruits and vegetables, whole grains, and beans. Activity  Exercise only as directed by your health care provider. Most women can continue their usual exercise routine during pregnancy. Try to exercise for 30 minutes at least 5 days a week. Stop exercising if you experience uterine contractions.  Avoid heavy   lifting.  Do not exercise in extreme heat or humidity, or at high altitudes.  Wear low-heel, comfortable shoes.  Practice good posture.  You may continue to have sex unless your health care provider tells you otherwise. Relieving pain and discomfort  Take frequent breaks and rest with your legs elevated if you have leg cramps or low back pain.  Take warm sitz baths to soothe any pain or discomfort caused by hemorrhoids. Use hemorrhoid cream if your health care provider approves.  Wear a good support bra to prevent discomfort from breast tenderness.  If you develop varicose veins: ? Wear support pantyhose or compression stockings as told by your healthcare provider. ? Elevate your feet for 15 minutes, 3-4 times a day. Prenatal care  Write down your questions. Take them to your prenatal visits.  Keep all your prenatal visits as told by your health care provider. This is important. Safety  Wear your seat belt at  all times when driving.  Make a list of emergency phone numbers, including numbers for family, friends, the hospital, and police and fire departments. General instructions  Avoid cat litter boxes and soil used by cats. These carry germs that can cause birth defects in the baby. If you have a cat, ask someone to clean the litter box for you.  Do not travel far distances unless it is absolutely necessary and only with the approval of your health care provider.  Do not use hot tubs, steam rooms, or saunas.  Do not drink alcohol.  Do not use any products that contain nicotine or tobacco, such as cigarettes and e-cigarettes. If you need help quitting, ask your health care provider.  Do not use any medicinal herbs or unprescribed drugs. These chemicals affect the formation and growth of the baby.  Do not douche or use tampons or scented sanitary pads.  Do not cross your legs for long periods of time.  To prepare for the arrival of your baby: ? Take prenatal classes to understand, practice, and ask questions about labor and delivery. ? Make a trial run to the hospital. ? Visit the hospital and tour the maternity area. ? Arrange for maternity or paternity leave through employers. ? Arrange for family and friends to take care of pets while you are in the hospital. ? Purchase a rear-facing car seat and make sure you know how to install it in your car. ? Pack your hospital bag. ? Prepare the baby's nursery. Make sure to remove all pillows and stuffed animals from the baby's crib to prevent suffocation.  Visit your dentist if you have not gone during your pregnancy. Use a soft toothbrush to brush your teeth and be gentle when you floss. Contact a health care provider if:  You are unsure if you are in labor or if your water has broken.  You become dizzy.  You have mild pelvic cramps, pelvic pressure, or nagging pain in your abdominal area.  You have lower back pain.  You have persistent  nausea, vomiting, or diarrhea.  You have an unusual or bad smelling vaginal discharge.  You have pain when you urinate. Get help right away if:  Your water breaks before 37 weeks.  You have regular contractions less than 5 minutes apart before 37 weeks.  You have a fever.  You are leaking fluid from your vagina.  You have spotting or bleeding from your vagina.  You have severe abdominal pain or cramping.  You have rapid weight loss or weight gain.    You have shortness of breath with chest pain.  You notice sudden or extreme swelling of your face, hands, ankles, feet, or legs.  Your baby makes fewer than 10 movements in 2 hours.  You have severe headaches that do not go away when you take medicine.  You have vision changes. Summary  The third trimester is from week 28 through week 40, months 7 through 9. The third trimester is a time when the unborn baby (fetus) is growing rapidly.  During the third trimester, your discomfort may increase as you and your baby continue to gain weight. You may have abdominal, leg, and back pain, sleeping problems, and an increased need to urinate.  During the third trimester your breasts will keep growing and they will continue to become tender. A yellow fluid (colostrum) may leak from your breasts. This is the first milk you are producing for your baby.  False labor is a condition in which you feel small, irregular tightenings of the muscles in the womb (contractions) that eventually go away. These are called Braxton Hicks contractions. Contractions may last for hours, days, or even weeks before true labor sets in.  Signs of labor can include: abdominal cramps; regular contractions that start at 10 minutes apart and become stronger and more frequent with time; watery or bloody mucus discharge that comes from the vagina; increased pelvic pressure and dull back pain; and leaking of amniotic fluid. This information is not intended to replace advice  given to you by your health care provider. Make sure you discuss any questions you have with your health care provider. Document Released: 11/01/2001 Document Revised: 04/14/2016 Document Reviewed: 01/08/2013 Elsevier Interactive Patient Education  2017 [employer]  

## 2017-10-09 ENCOUNTER — Encounter (HOSPITAL_COMMUNITY): Payer: Self-pay

## 2018-01-06 IMAGING — US US MFM OB FOLLOW-UP
1 series · 13 of 28 positions shown · non-contrast
Comparison: none

[Series 1: us mfm ob follow-up · 99 acquisitions, 13 frames shown]
[im 4/99]
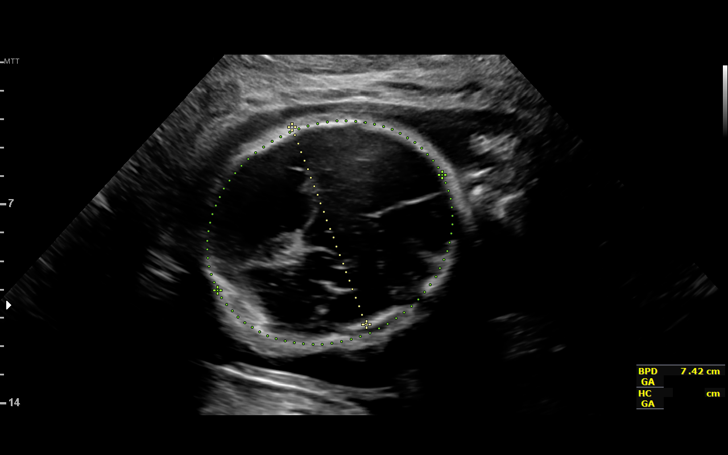
[im 11/99]
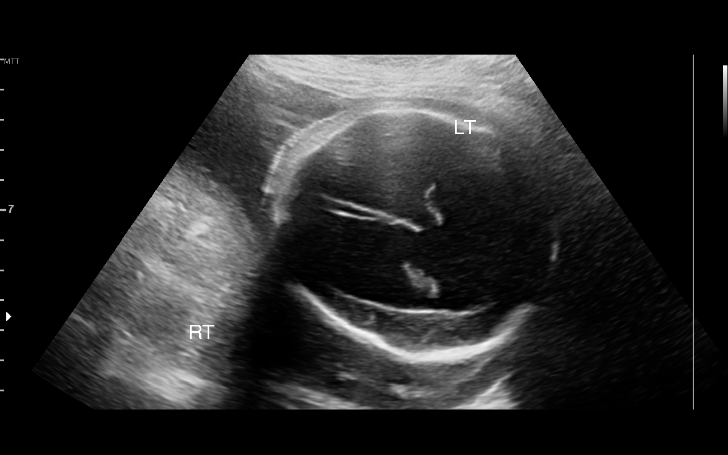
[im 19/99]
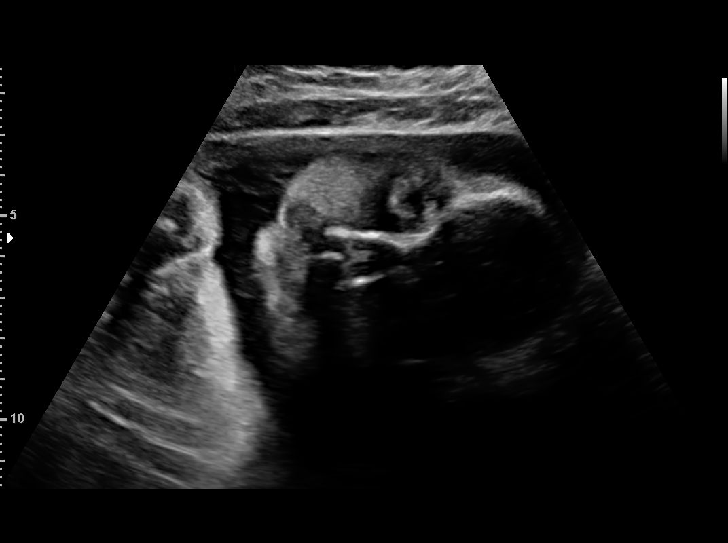
[im 26/99]
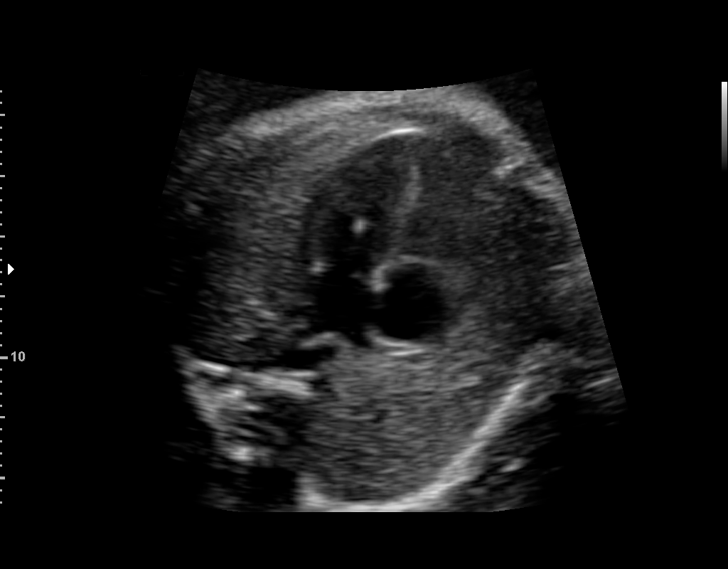
[im 33/99]
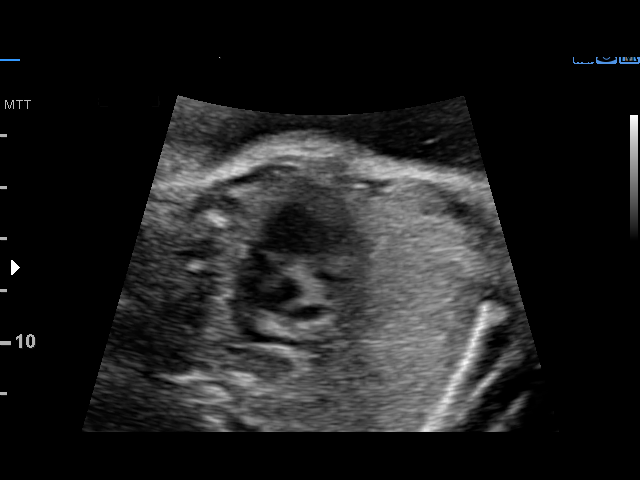
[im 40/99]
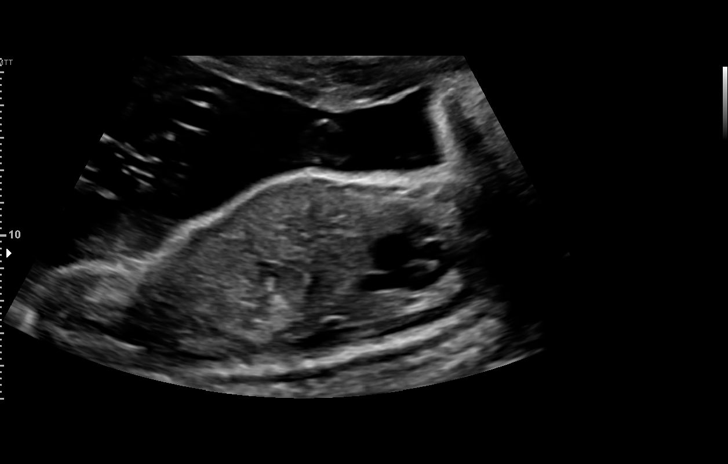
[im 51/99]
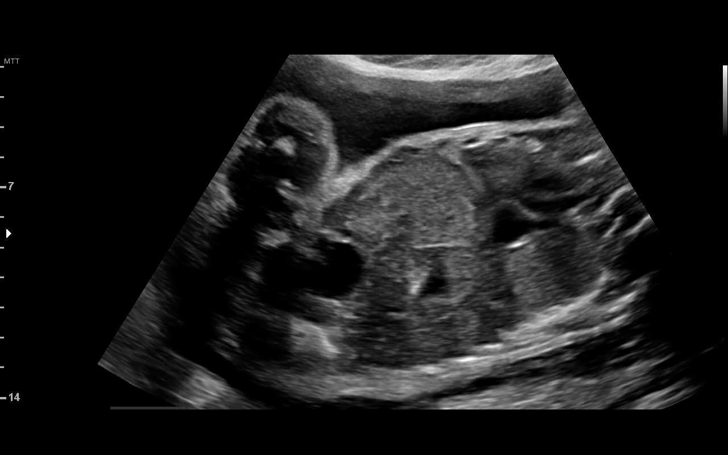
[im 59/99]
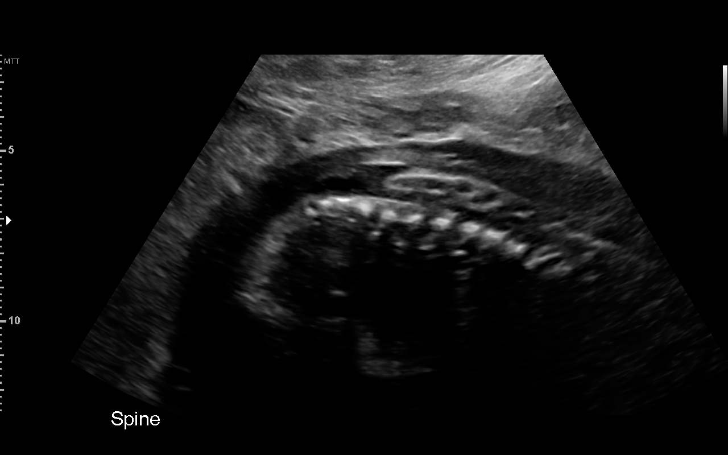
[im 66/99]
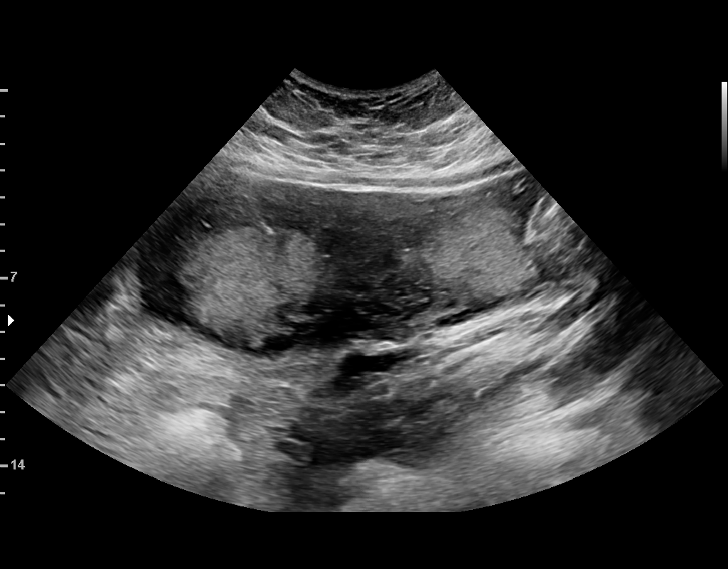
[im 73/99]
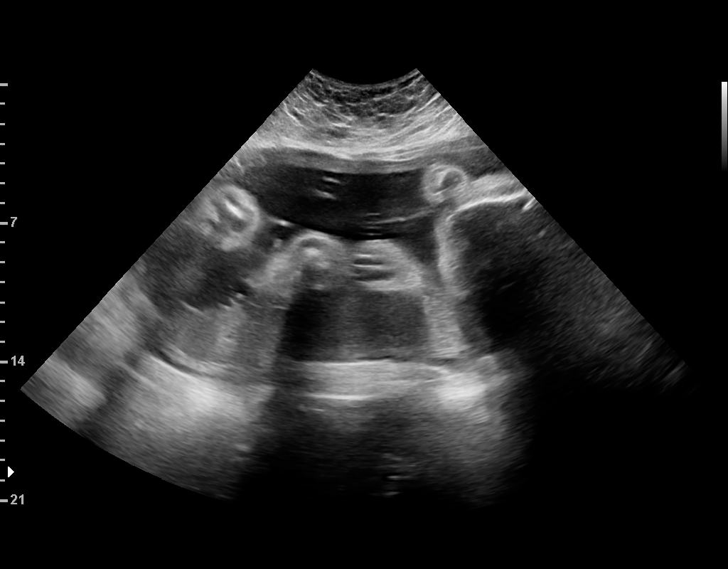
[im 80/99]
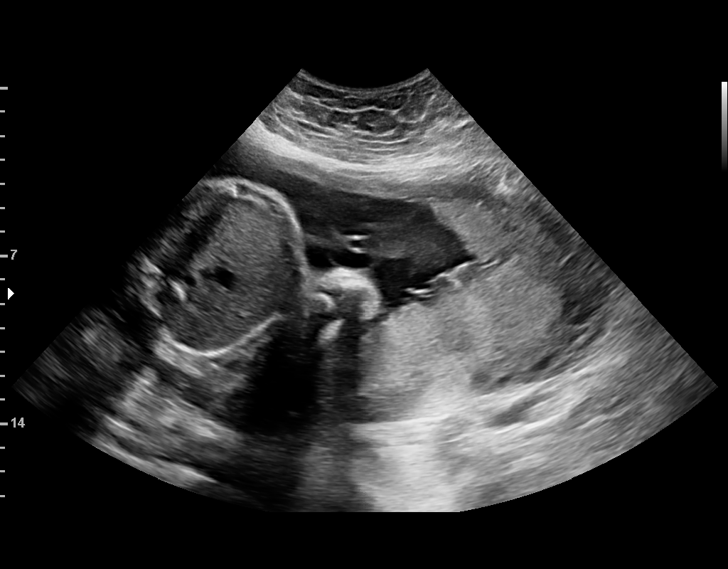
[im 88/99]
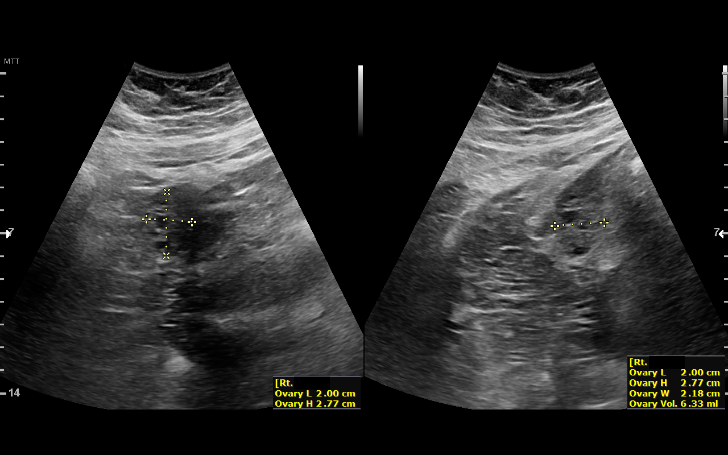
[im 95/99]
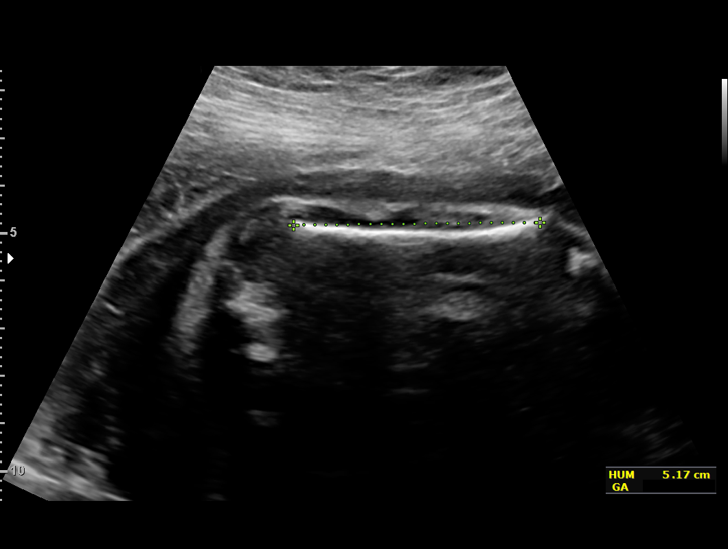

[13 of 28 positions shown; findings below may reference images not displayed]

#[id number]

1  ANDIYA SOMAYE              [id number]      [id number]     [id number]
Indications

30 weeks gestation of pregnancy
Encounter for other antenatal screening
follow-up
Obesity complicating pregnancy, second
trimester
Fetal abnormality - other known or
suspected; ONTD with A-C malformation,
type 2 and ventriculomegaly; low risk NIPS;
normal ECHO x 2
OB History

Blood Type:            Height:  5'8"   Weight (lb):  270      BMI:
Gravidity:    1         Term:   0        Prem:   0        SAB:   0
TOP:          0       Ectopic:  0        Living: 0
Fetal Evaluation

Num Of Fetuses:     1
Fetal Heart         149
Rate(bpm):
Cardiac Activity:   Observed
Presentation:       Cephalic
Placenta:           Left Posterior, above cervical os
P. Cord Insertion:  Visualized, central

Amniotic Fluid
AFI FV:      Subjectively low-normal

AFI Sum(cm)     %Tile       Largest Pocket(cm)
10.28           16

RUQ(cm)       RLQ(cm)       LUQ(cm)        LLQ(cm)
0
Biometry

BPD:      76.5  mm     G. Age:  30w 5d         46  %    CI:        84.05   %   70 - 86
FL/HC:      21.1   %   19.2 -
HC:       263   mm     G. Age:  28w 4d        < 3  %    HC/AC:      1.03       0.99 -
AC:       256   mm     G. Age:  29w 6d         27  %    FL/BPD:     72.7   %   71 - 87
FL:       55.6  mm     G. Age:  29w 2d         10  %    FL/AC:      21.7   %   20 - 24
HUM:      51.9  mm     G. Age:  30w 2d         50  %
CER:      32.8  mm     G. Age:  28w 6d         20  %

Est. FW:    3936  gm      3 lb 2 oz     36  %
Gestational Age

LMP:           30w 3d       Date:   10/10/16                 EDD:   07/17/17
U/S Today:     29w 4d                                        EDD:   07/23/17
Best:          30w 3d    Det. By:   LMP  (10/10/16)          EDD:   07/17/17
Anatomy

Cranium:               Mq sign             LVOT:                   Appears normal
Cavum:                 Abnormal, see          Aortic Arch:            Appears normal
comments
Ventricles:            Bilat.ventriculomeg    Ductal Arch:            Appears normal
Laheri; 2.7 cms
Choroid Plexus:        Dangling Choroid       Diaphragm:              Appears normal
Cerebellum:            Banana sign            Stomach:                Appears normal, left
sided
Posterior Fossa:       Abnormal, see          Abdomen:                Previously seen
comments
Nuchal Fold:           Previously seen        Abdominal Wall:         Previously seen
Face:                  Orbits and profile     Cord Vessels:           Previously seen
previously seen
Lips:                  Previously seen        Kidneys:                Appear normal
Palate:                Previously seen        Bladder:                Appears normal
Thoracic:              Previously seen        Spine:                  Neural tube defect
Heart:                 Appears normal         Upper Extremities:      Previously seen
(4CH, axis, and
situs)
RVOT:                  Appears normal         Lower Extremities:      Previously seen

Other:  Rt 5th digit previously visualized.  Technically difficult due to maternal
habitus and fetal position.
Cervix Uterus Adnexa

Cervix
Not visualized (advanced GA >62wks)

Uterus
No abnormality visualized.

Left Ovary
Size(cm)       2.6 x    1.77   x  1.3       Vol(ml):
Within normal limits. No adnexal mass visualized.

Right Ovary
Size(cm)        2  x    2.18   x  2.77      Vol(ml):
Within normal limits. No adnexal mass visualized.
Cul De Sac:   No free fluid seen.
Adnexa:       No abnormality visualized.
Impression

SIUP at 30+3 weeks
L-S myelomeningocele; Arnold-Chiari malformation, type 2;
moderate to severe ventriculomegaly
All other interval fetal anatomy was seen and appeared
normal; anatomic survey complete
Normal amniotic fluid volume
Appropriate interval growth with EFW at the 36th %tile
Recommendations

Follow-up ultrasound for growth in 4 weeks

## 2018-02-11 IMAGING — US US MFM OB LIMITED
1 series · 15 of 24 positions shown · non-contrast
Comparison: none

[Series 1: us mfm ob limited · 24 acquisitions, 15 frames shown]
[im 1/24]
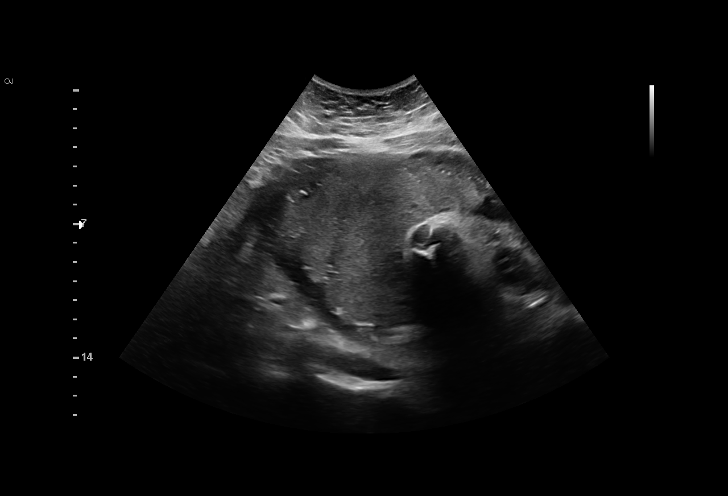
[im 3/24]
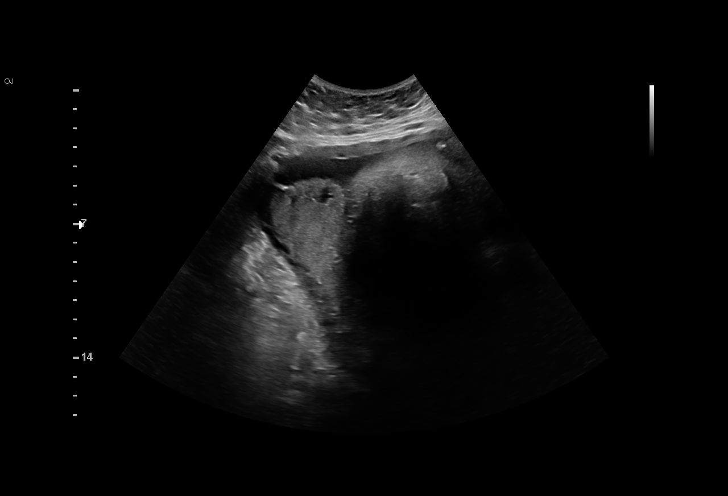
[im 5/24]
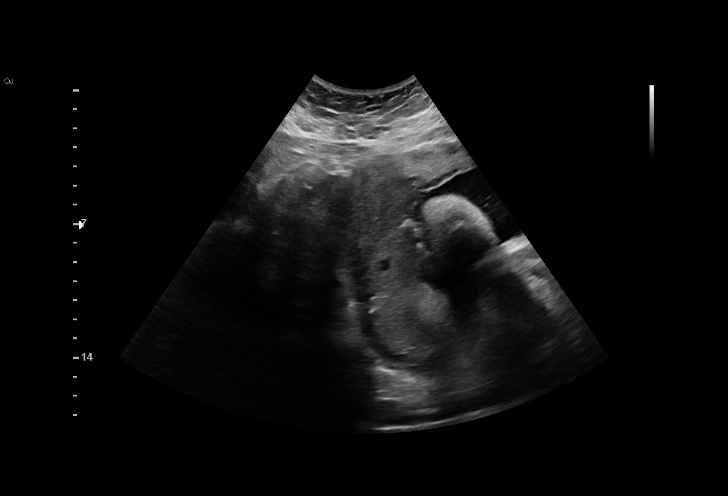
[im 6/24]
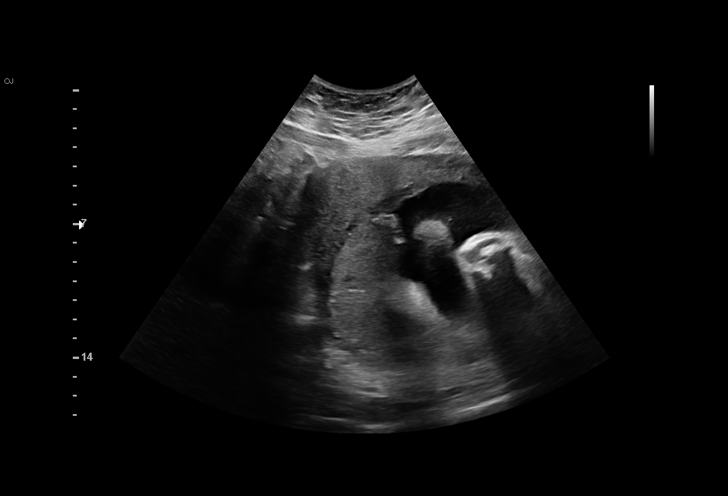
[im 8/24]
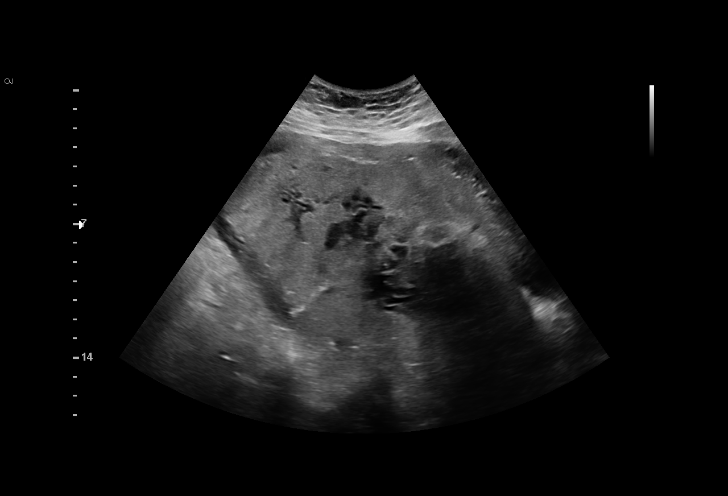
[im 9/24]
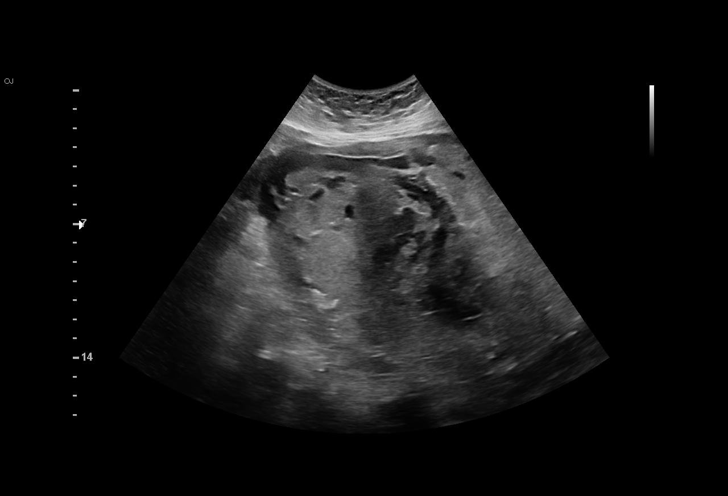
[im 11/24]
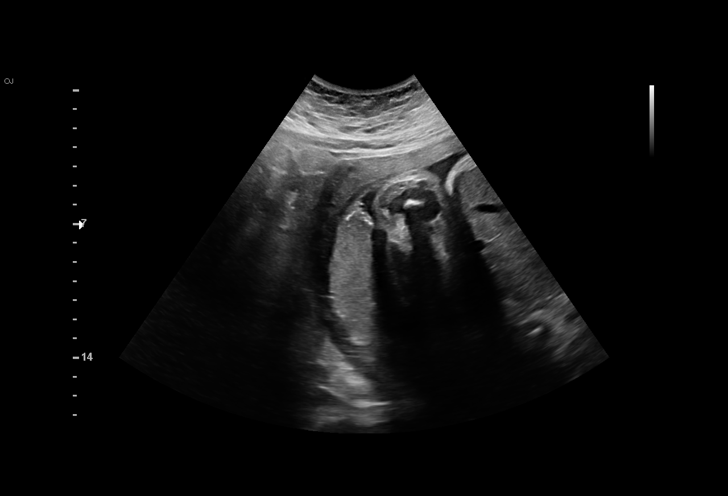
[im 13/24]
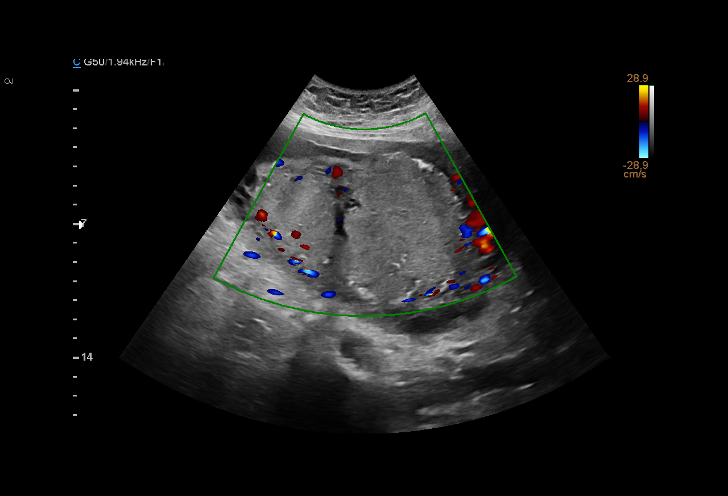
[im 14/24]
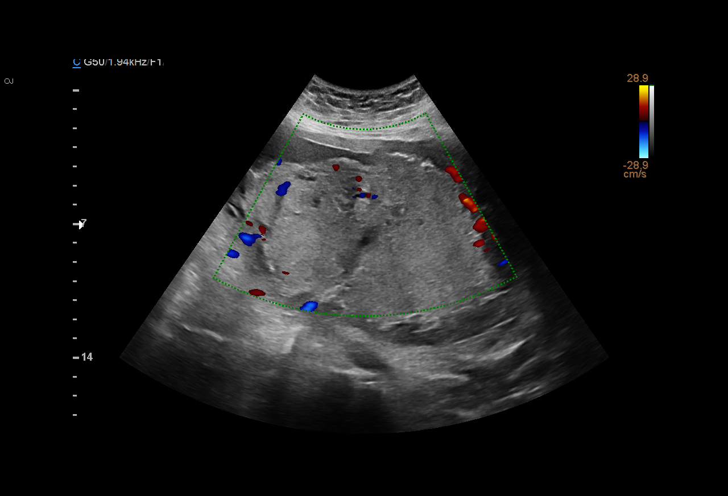
[im 16/24]
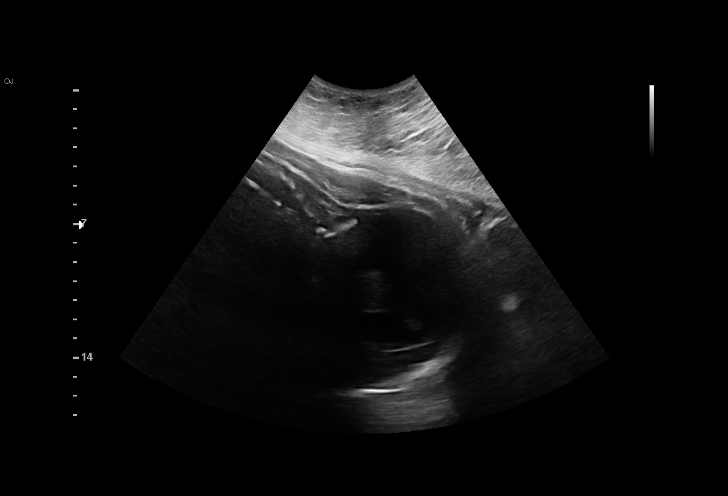
[im 17/24]
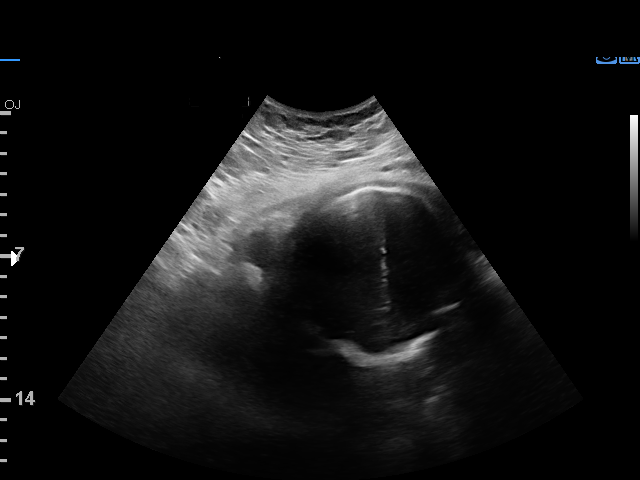
[im 19/24]
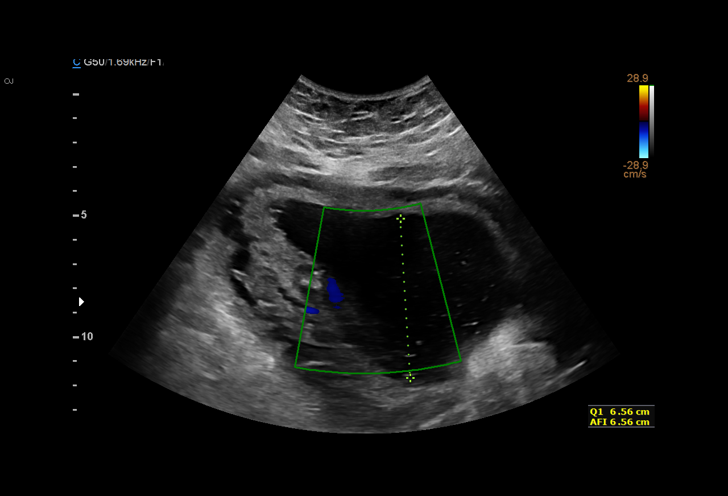
[im 21/24]
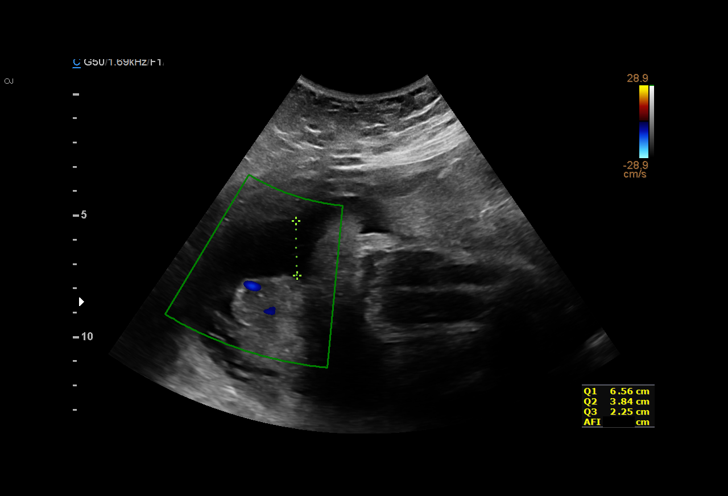
[im 22/24]
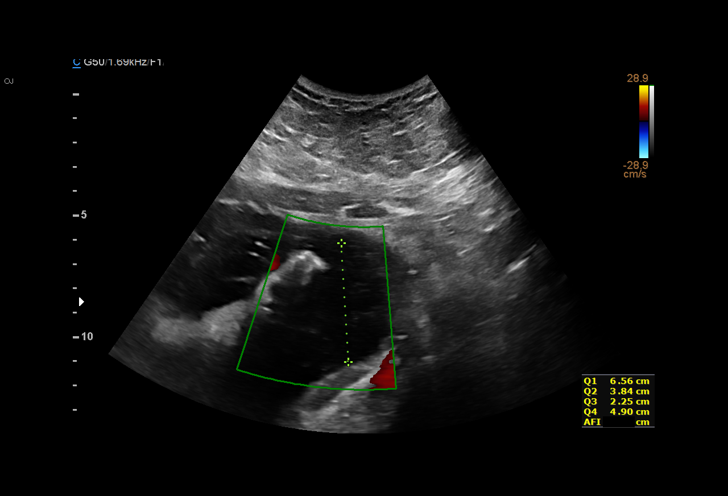
[im 24/24]
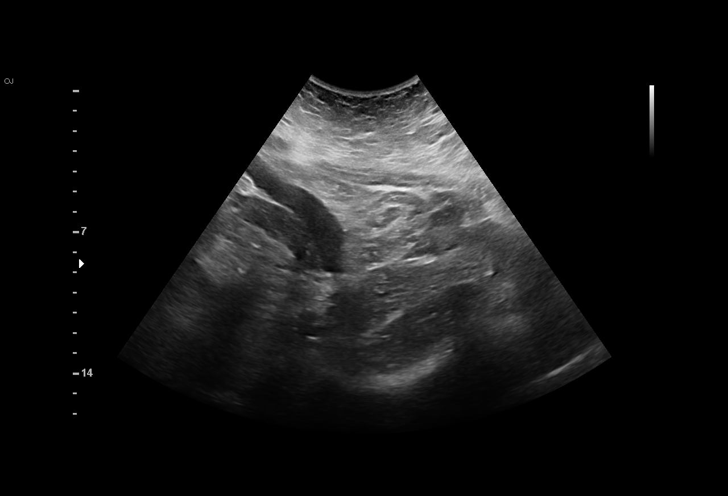

[15 of 24 positions shown; findings below may reference images not displayed]

Performed By:     Seklys Tireviciene         Secondary Phy.:   [REDACTED] [facility]
#[id number]
MAU/Triage

Indications

35 weeks gestation of pregnancy
Fetal abnormality - other known or
suspected; ONTD with A-C malformation,
type 2 and ventriculomegaly; low risk NIPS;
normal ECHO x 2
Traumatic injury during pregnancy (pt fell on
abdomen)
OB History

Blood Type:            Height:  5'8"   Weight (lb):  270      BMI:
Gravidity:    1         Term:   0        Prem:   0        SAB:   0
TOP:          0       Ectopic:  0        Living: 0
Fetal Evaluation

Num Of Fetuses:     1
Fetal Heart         153
Rate(bpm):
Cardiac Activity:   Observed
Presentation:       Cephalic
Placenta:           Left posterior, above cervical os
P. Cord Insertion:  Previously Visualized

Amniotic Fluid
AFI FV:      Subjectively within normal limits

AFI Sum(cm)     %Tile       Largest Pocket(cm)
17.55           65

RUQ(cm)       RLQ(cm)       LUQ(cm)        LLQ(cm)
6.56
Gestational Age

LMP:           35w 4d       Date:   10/10/16                 EDD:   07/17/17
Best:          35w 4d    Det. By:   LMP  (10/10/16)          EDD:   07/17/17
Cervix Uterus Adnexa

Cervix
Not visualized (advanced GA >36wks)

Uterus
No abnormality visualized.

Left Ovary
Not visualized. No adnexal mass visualized.

Right Ovary
Not visualized. No adnexal mass visualized.
Impression

Single IUP at 35w 4d
Fetus with known myelomenigocele / ventriculomegaly
Limited ultrasound performed s/p fall
Cephalic presentation
Left posterior placenta without previa
No sonographic findings suggestive of abruption noted
Normal amniotic fluid volume
Recommendations

Follow-up ultrasounds as clinically indicated.

## 2020-01-30 ENCOUNTER — Ambulatory Visit: Payer: Self-pay | Attending: Family

## 2020-01-30 DIAGNOSIS — Z23 Encounter for immunization: Secondary | ICD-10-CM

## 2020-01-30 NOTE — Progress Notes (Signed)
   Covid-19 Vaccination Clinic  Name:  Katrina Perez    MRN: [id number] DOB: 02/23/1991  01/30/2020  [name] was observed post Covid-19 immunization for 15 minutes without incident. She was provided with Vaccine Information Sheet and instruction to access the V-Safe system.   [name] was instructed to call 911 with any severe reactions post vaccine: Marland Kitchen Difficulty breathing  . Swelling of face and throat  . A fast heartbeat  . A bad rash all over body  . Dizziness and weakness   Immunizations Administered    Name Date Dose VIS Date Route   Moderna COVID-19 Vaccine 01/30/2020 12:47 PM 0.5 mL 10/22/2019 Intramuscular   Manufacturer: Moderna   Lot: [id number]   NDC: 36681-594-70

## 2020-03-03 ENCOUNTER — Ambulatory Visit: Payer: Self-pay | Attending: Family

## 2020-03-03 DIAGNOSIS — Z23 Encounter for immunization: Secondary | ICD-10-CM

## 2020-03-03 NOTE — Progress Notes (Signed)
   Covid-19 Vaccination Clinic  Name:  Katrina Perez    MRN: [id number] DOB: [birth year]  03/03/2020  [name] was observed post Covid-19 immunization for 15 minutes without incident. She was provided with Vaccine Information Sheet and instruction to access the V-Safe system.   [name] was instructed to call 911 with any severe reactions post vaccine: Marland Kitchen Difficulty breathing  . Swelling of face and throat  . A fast heartbeat  . A bad rash all over body  . Dizziness and weakness   Immunizations Administered    Name Date Dose VIS Date Route   Moderna COVID-19 Vaccine 03/03/2020 10:15 AM 0.5 mL 10/22/2019 Intramuscular   Manufacturer: Moderna   Lot: [id number]   NDC: 36468-032-12

## 2023-10-11 ENCOUNTER — Emergency Department (HOSPITAL_BASED_OUTPATIENT_CLINIC_OR_DEPARTMENT_OTHER)
Admission: EM | Admit: 2023-10-11 | Discharge: 2023-10-11 | Disposition: A | Discharge status: Home / Self Care | Payer: Medicaid Other | Attending: Emergency Medicine | Admitting: Emergency Medicine

## 2023-10-11 ENCOUNTER — Encounter (HOSPITAL_BASED_OUTPATIENT_CLINIC_OR_DEPARTMENT_OTHER): Payer: Self-pay

## 2023-10-11 ENCOUNTER — Other Ambulatory Visit: Payer: Self-pay

## 2023-10-11 ENCOUNTER — Emergency Department (HOSPITAL_BASED_OUTPATIENT_CLINIC_OR_DEPARTMENT_OTHER): Payer: Medicaid Other

## 2023-10-11 ENCOUNTER — Emergency Department (HOSPITAL_COMMUNITY): Payer: Medicaid Other

## 2023-10-11 DIAGNOSIS — Z7982 Long term (current) use of aspirin: Secondary | ICD-10-CM | POA: Diagnosis not present

## 2023-10-11 DIAGNOSIS — Z91040 Latex allergy status: Secondary | ICD-10-CM | POA: Insufficient documentation

## 2023-10-11 DIAGNOSIS — K858 Other acute pancreatitis without necrosis or infection: Secondary | ICD-10-CM | POA: Insufficient documentation

## 2023-10-11 DIAGNOSIS — R109 Unspecified abdominal pain: Secondary | ICD-10-CM | POA: Diagnosis present

## 2023-10-11 DIAGNOSIS — K8021 Calculus of gallbladder without cholecystitis with obstruction: Secondary | ICD-10-CM | POA: Insufficient documentation

## 2023-10-11 DIAGNOSIS — K859 Acute pancreatitis without necrosis or infection, unspecified: Secondary | ICD-10-CM

## 2023-10-11 LAB — CBC WITH DIFFERENTIAL/PLATELET
Abs Immature Granulocytes: 0.03 10*3/uL (ref 0.00–0.07)
Basophils Absolute: 0.1 10*3/uL (ref 0.0–0.1)
Basophils Relative: 1 %
Eosinophils Absolute: 0.1 10*3/uL (ref 0.0–0.5)
Eosinophils Relative: 1 %
HCT: 41.6 % (ref 36.0–46.0)
Hemoglobin: 14.2 g/dL (ref 12.0–15.0)
Immature Granulocytes: 0 %
Lymphocytes Relative: 24 %
Lymphs Abs: 2.5 10*3/uL (ref 0.7–4.0)
MCH: 33 pg (ref 26.0–34.0)
MCHC: 34.1 g/dL (ref 30.0–36.0)
MCV: 96.7 fL (ref 80.0–100.0)
Monocytes Absolute: 0.6 10*3/uL (ref 0.1–1.0)
Monocytes Relative: 6 %
Neutro Abs: 7.2 10*3/uL (ref 1.7–7.7)
Neutrophils Relative %: 68 %
Platelets: 288 10*3/uL (ref 150–400)
RBC: 4.3 MIL/uL (ref 3.87–5.11)
RDW: 12.1 % (ref 11.5–15.5)
WBC: 10.5 10*3/uL (ref 4.0–10.5)
nRBC: 0 % (ref 0.0–0.2)

## 2023-10-11 LAB — COMPREHENSIVE METABOLIC PANEL
ALT: 233 U/L — ABNORMAL HIGH (ref 0–44)
AST: 275 U/L — ABNORMAL HIGH (ref 15–41)
Albumin: 3.9 g/dL (ref 3.5–5.0)
Alkaline Phosphatase: 91 U/L (ref 38–126)
Anion gap: 10 (ref 5–15)
BUN: 13 mg/dL (ref 6–20)
CO2: 23 mmol/L (ref 22–32)
Calcium: 9.5 mg/dL (ref 8.9–10.3)
Chloride: 103 mmol/L (ref 98–111)
Creatinine, Ser: 0.75 mg/dL (ref 0.44–1.00)
GFR, Estimated: >60 mL/min (ref >60)
Glucose, Bld: 160 mg/dL — ABNORMAL HIGH (ref 70–99)
Potassium: 4.2 mmol/L (ref 3.5–5.1)
Sodium: 136 mmol/L (ref 135–145)
Total Bilirubin: 1.2 mg/dL — ABNORMAL HIGH (ref <1.2)
Total Protein: 7.7 g/dL (ref 6.5–8.1)

## 2023-10-11 LAB — HCG, QUANTITATIVE, PREGNANCY: hCG, Beta Chain, Quant, S: <1 m[IU]/mL (ref <5)

## 2023-10-11 LAB — LIPASE, BLOOD: Lipase: 84 U/L — ABNORMAL HIGH (ref 11–51)

## 2023-10-11 MED ORDER — MORPHINE SULFATE (PF) 4 MG/ML IV SOLN
4.0000 mg | Freq: Once | INTRAVENOUS | Status: AC
  Administered 2023-10-11: 4 mg via INTRAVENOUS
  Filled 2023-10-11: qty 1

## 2023-10-11 MED ORDER — GADOBUTROL 1 MMOL/ML IV SOLN
10.0000 mL | Freq: Once | INTRAVENOUS | Status: AC | PRN
  Administered 2023-10-11: 10 mL via INTRAVENOUS

## 2023-10-11 MED ORDER — ONDANSETRON HCL 4 MG/2ML IJ SOLN
4.0000 mg | Freq: Once | INTRAMUSCULAR | Status: AC
  Administered 2023-10-11: 4 mg via INTRAVENOUS
  Filled 2023-10-11: qty 2

## 2023-10-11 NOTE — ED Notes (Signed)
Returned from U/S

## 2023-10-11 NOTE — Discharge Instructions (Addendum)
You were seen in the emergency department for abdominal pain.  Your liver numbers were mildly elevated and you had an ultrasound and an MRCP which showed signs of gallstones but no definite obstruction.  General surgery saw you here and felt it was reasonable for you to go home and continue to monitor your symptoms and follow-up with your surgical team at [facility].  Return to the emergency department if any high fevers or unrelenting pain.  At this time you are stable for discharge.  Please follow-up with your team at [facility] on Tuesday.  Please call them sooner if any worsening or concerning symptoms

## 2023-10-11 NOTE — Consult Note (Signed)
Consult Note  Edison Nasuti 24-Nov-1990  [id number].    Requesting MD: Dr. Charm Barges Chief Complaint/Reason for Consult: Biliary colic  HPI:  32 y.o. female with medical history significant for tension headaches who presented to [facility] ED with abdominal pain, nausea, vomiting.  Pain began last night prior to going to bed as mild epigastric pain.  She had had a protein shake for dinner.  Later the pain woke her from sleeping as it had significantly worsened and was radiating to her back.  She came to the ED for further evaluation and had episode of nausea/emesis while in the ED.  She has had 1 prior episode of similar symptoms over this weekend but they were much more mild.  Workup in the ED was significant for elevated LFTs, hyperbilirubinemia, [place] with cholelithiasis and mildly dilated CBD, MRCP negative for choledocholithiasis. There was no radiographic evidence for cholecystitis.   At time of my visit pain is almost completely resolved and she complains of some mild nausea with last p.o. intake dinner last night.  She has a gastric sleeve procedure planned next Tuesday at [facility]   ROS: Reviewed and as above  Family History  Problem Relation Age of Onset   Hypertension Unknown        grandparents on both sides   Heart attack Maternal Grandfather    Hyperlipidemia Maternal Grandfather    Diabetes Maternal Grandfather    Colon cancer Unknown        paternal great grandfather   Depression Mother        and mother's side of family   Bipolar disorder Mother        and mother's side of family   Anxiety disorder Mother        and mother's side of family   Diabetes Maternal Aunt     Past Medical History:  Diagnosis Date   Bilateral ankle fractures    Frequent headaches    tension headaches per patient   MRSA (methicillin resistant staph aureus) culture positive    bilater armpits    Past Surgical History:  Procedure Laterality Date   left hip  surgery     "replace hip in socket"    Social History:  reports that she has never smoked. She has never used smokeless tobacco. She reports current alcohol use. She reports that she does not use drugs.  Allergies:  Allergies  Allergen Reactions   Buspirone Other (See Comments)    Dizziness, lightheadedness, heart flutter/palpitations   Dizziness, lightheadedness, heart flutter/palpitations   Bactrim [Sulfamethoxazole-Trimethoprim] Hives   Latex Hives    (Not in a hospital admission)   Blood pressure 103/73, pulse 87, temperature 98.2 F (36.8 C), resp. rate 17, height 5\' 7"  (1.702 m), weight 122.5 kg, SpO2 100%, unknown if currently breastfeeding. Physical Exam: General: pleasant, WD, female who is laying in bed in NAD HEENT: head is normocephalic, atraumatic.  Sclera are noninjected.  Pupils equal and round. EOMs intact.  Ears and nose without any masses or lesions.  Mouth is pink and moist Lungs: Respiratory effort nonlabored Abd: soft, NT, ND, no masses, hernias, or organomegaly MSK: all 4 extremities are symmetrical with no cyanosis, clubbing, or edema. Skin: warm and dry with no masses, lesions, or rashes Neuro: Cranial nerves 2-12 grossly intact, sensation is normal throughout Psych: A&Ox3 with an appropriate affect.    Results for orders placed or performed during the hospital encounter of 10/11/23 (from the past 48  hour(s))  CBC with Differential     Status: None   Collection Time: 10/11/23  4:29 AM  Result Value Ref Range   WBC 10.5 4.0 - 10.5 K/uL   RBC 4.30 3.87 - 5.11 MIL/uL   Hemoglobin 14.2 12.0 - 15.0 g/dL   HCT 86.5 78.4 - 69.6 %   MCV 96.7 80.0 - 100.0 fL   MCH 33.0 26.0 - 34.0 pg   MCHC 34.1 30.0 - 36.0 g/dL   RDW 29.5 28.4 - 13.2 %   Platelets 288 150 - 400 K/uL   nRBC 0.0 0.0 - 0.2 %   Neutrophils Relative % 68 %   Neutro Abs 7.2 1.7 - 7.7 K/uL   Lymphocytes Relative 24 %   Lymphs Abs 2.5 0.7 - 4.0 K/uL   Monocytes Relative 6 %   Monocytes  Absolute 0.6 0.1 - 1.0 K/uL   Eosinophils Relative 1 %   Eosinophils Absolute 0.1 0.0 - 0.5 K/uL   Basophils Relative 1 %   Basophils Absolute 0.1 0.0 - 0.1 K/uL   Immature Granulocytes 0 %   Abs Immature Granulocytes 0.03 0.00 - 0.07 K/uL    Comment: Performed at [facility], [address] [place], [place] [address]  Comprehensive metabolic panel     Status: Abnormal   Collection Time: 10/11/23  4:29 AM  Result Value Ref Range   Sodium 136 135 - 145 mmol/L   Potassium 4.2 3.5 - 5.1 mmol/L   Chloride 103 98 - 111 mmol/L   CO2 23 22 - 32 mmol/L   Glucose, Bld 160 (H) 70 - 99 mg/dL    Comment: Glucose reference range applies only to samples taken after fasting for at least 8 hours.   BUN 13 6 - 20 mg/dL   Creatinine, Ser 2.72 0.44 - 1.00 mg/dL   Calcium 9.5 8.9 - 53.6 mg/dL   Total Protein 7.7 6.5 - 8.1 g/dL   Albumin 3.9 3.5 - 5.0 g/dL   AST 644 (H) 15 - 41 U/L   ALT 233 (H) 0 - 44 U/L   Alkaline Phosphatase 91 38 - 126 U/L   Total Bilirubin 1.2 (H) <1.2 mg/dL   GFR, Estimated >03 >47 mL/min    Comment: (NOTE) Calculated using the CKD-EPI Creatinine Equation (2021)    Anion gap 10 5 - 15    Comment: Performed at [facility], [address], [place], [place] [address]  Lipase, blood     Status: Abnormal   Collection Time: 10/11/23  4:29 AM  Result Value Ref Range   Lipase 84 (H) 11 - 51 U/L    Comment: Performed at [facility], [address] [place], [place] [address]  hCG, quantitative, pregnancy     Status: None   Collection Time: 10/11/23  4:29 AM  Result Value Ref Range   hCG, Beta Chain, Quant, S <1 <5 mIU/mL    Comment:          GEST. AGE      CONC.  (mIU/mL)   <=1 WEEK        5 - 50     2 WEEKS       50 - 500     3 WEEKS       100 - 10,000     4 WEEKS     1,000 - 30,000     5 WEEKS     3,500 - 115,000   6-8 WEEKS     12,000 -  270,000    12 WEEKS     15,000 - 220,000        FEMALE AND NON-PREGNANT  FEMALE:     LESS THAN 5 mIU/mL Performed at [facility], [address] [place], [place] [address]    MR ABDOMEN MRCP W WO CONTAST  Result Date: 10/11/2023 CLINICAL DATA:  Sharp epigastric pain waking patient from sleep EXAM: MRI ABDOMEN WITHOUT AND WITH CONTRAST (INCLUDING MRCP) TECHNIQUE: Multiplanar multisequence MR imaging of the abdomen was performed both before and after the administration of intravenous contrast. Heavily T2-weighted images of the biliary and pancreatic ducts were obtained, and three-dimensional MRCP images were rendered by post processing. CONTRAST:  10mL GADAVIST GADOBUTROL 1 MMOL/ML IV SOLN COMPARISON:  Ultrasound abdomen dated 10/11/2023 FINDINGS: Lower chest: No acute findings. Hepatobiliary: Hepatic steatosis. Subcentimeter mildly T2 hyperintense foci in segment 7 (5:11) appears to demonstrate gradual enhancement and may represent hemangiomas. No bile duct dilation. Common bile duct measures 6 mm. Cholelithiasis. Minimal pericholecystic stranding. Pancreas: No mass, inflammatory changes, or other parenchymal abnormality identified. Spleen:  Within normal limits in size and appearance. Adrenals/Urinary Tract: No adrenal nodules. No suspicious renal masses identified. No evidence of hydronephrosis. Stomach/Bowel: Underdistended ascending colon. Visualized portions within the abdomen are otherwise unremarkable. Vascular/Lymphatic: No pathologically enlarged lymph nodes identified. No abdominal aortic aneurysm demonstrated. Other:  None. Musculoskeletal: No suspicious bone lesions identified. IMPRESSION: 1. Cholelithiasis with minimal pericholecystic stranding, which is nonspecific and may be reactive. No choledocholithiasis. 2. Hepatic steatosis. Electronically Signed   By: Agustin Cree M.D.   On: 10/11/2023 10:04   US Abdomen Limited RUQ (LIVER/GB)  Result Date: 10/11/2023 CLINICAL DATA:  32 year old female with epigastric pain radiating to the back for 2  days. EXAM: ULTRASOUND ABDOMEN LIMITED RIGHT UPPER QUADRANT COMPARISON:  [facility] Right upper quadrant ultrasound 03/30/2023. FINDINGS: Gallbladder: Layering sludge or stones (image 2) with some shadowing. Shadowing stones on image 15, individual stone size up to 14 mm. Gallbladder wall thickness remains normal. No pericholecystic fluid. No sonographic Murphy sign elicited. Common bile duct: Diameter: 6-7 mm (previously 4 mm) and mildly dilated for age. No filling defect within the visible duct. Liver: Echogenic liver again noted (images 35, 44). No intrahepatic biliary ductal dilatation or discrete liver lesion. Portal vein is patent on color Doppler imaging with normal direction of blood flow towards the liver. Other: Negative visible right kidney.  No free fluid. IMPRESSION: 1. Positive for cholelithiasis with borderline to mildly dilated CBD. Choledocholithiasis difficult to exclude. No evidence of acute cholecystitis. 2. Hepatic steatosis. Electronically Signed   By: Odessa Fleming M.D.   On: 10/11/2023 06:19      Assessment/Plan Symptomatic cholelithiasis  Patient seen and examined and relevant labs and imaging personally reviewed.  She had significant abdominal pain yesterday with a more mild episode over this past weekend.  Presentation is compatible with symptomatic cholelithiasis.  LFTs elevated and hyperbilirubinemia with negative MRCP - she may have passed a stone.  Her pain is almost completely resolved and WBC normal.  I do not think she has acute cholecystitis.  We discussed options for management including obs overnight and laparoscopic cholecystectomy tomorrow versus follow-up with [facility] given she has a sleeve gastrectomy planned for next week.  We discussed the potential delay in her planned bariatric surgery should we operate acutely and that without signs/symptoms of acute cholecystitis urgent intervention is not necessary.  She would like [facility] with [facility].  We discussed the  signs/symptoms of infection and return  precautions    I reviewed ED provider notes, last 24 h vitals and pain scores, last 48 h intake and output, last 24 h labs and trends, and last 24 h imaging results.   Eric Form, [facility] 10/11/2023, 1:28 PM Please see Amion for pager number during day hours 7:00am-4:30pm

## 2023-10-11 NOTE — ED Notes (Signed)
Pt. To u/s via wc

## 2023-10-11 NOTE — ED Triage Notes (Signed)
Complaining of pain in the epigastric area that goes through to the back. Is an ache/pressure type pain.

## 2023-10-11 NOTE — ED Provider Notes (Signed)
[place] EMERGENCY DEPARTMENT AT [facility] Provider Note   CSN: [id number] Arrival date & time: 10/11/23  0348     History  Chief Complaint  Patient presents with  . Abdominal Pain    Katrina Perez is a 32 y.o. female.  HPI     This is a 32 year old female who presents with abdominal pain.  Patient reports that she had some slight epigastric abdominal pain prior to going to bed last night.  However she woke up abruptly from sleep with sharp epigastric pain that radiated to her back.  She is never had pain like this before.  She reports that she is currently on a preop diet to have a gastric sleeve so had her normal shake for dinner.  This is not new for her.  She has not had any fevers.  Reports nausea without vomiting.  Home Medications Prior to Admission medications   Medication Sig Start Date End Date Taking? Authorizing Provider  aspirin EC 81 MG tablet Take 1 tablet (81 mg total) by mouth daily. Take 12 to 36 weeks for prevention of preeclampssia later in pregnancy 03/02/17   Katrinka Blazing, [place], CNM  Doxylamine-Pyridoxine (DICLEGIS) 10-10 MG TBEC Take 2 tablets by mouth at bedtime. If symptoms persist, add 1 tab in AM starting on day 3. If needed, add 1 tab in PM starting day 4. 01/30/17   Aviva Signs, CNM  Misc. Devices (BREAST PUMP) MISC Dispense one breast pump for patient Patient not taking: Reported on 04/10/2017 03/27/17   Dorathy Kinsman, CNM  Prenatal Vit-Fe Fumarate-FA (PRENATAL MULTIVITAMIN) TABS tablet Take 1 tablet by mouth at bedtime.    [provider]      Allergies    Bactrim [sulfamethoxazole-trimethoprim] and Latex    Review of Systems   Review of Systems  Constitutional:  Negative for fever.  Respiratory:  Negative for shortness of breath.   Cardiovascular:  Negative for chest pain.  Gastrointestinal:  Positive for abdominal pain and nausea.  All other systems reviewed and are negative.   Physical Exam Updated Vital Signs BP  115/88   Pulse 80   Temp 98.2 F (36.8 C) (Oral)   Resp 18   Ht 1.702 m (5\' 7" )   Wt 122.5 kg   SpO2 99%   BMI 42.29 kg/m  Physical Exam Vitals and nursing note reviewed.  Constitutional:      Appearance: She is well-developed. She is obese. She is not ill-appearing.  HENT:     Head: Normocephalic and atraumatic.  Eyes:     Pupils: Pupils are equal, round, and reactive to light.  Cardiovascular:     Rate and Rhythm: Normal rate and regular rhythm.     Heart sounds: Normal heart sounds.  Pulmonary:     Effort: Pulmonary effort is normal. No respiratory distress.     Breath sounds: No wheezing.  Abdominal:     General: Bowel sounds are normal.     Palpations: Abdomen is soft.     Tenderness: There is abdominal tenderness in the epigastric area. There is no guarding or rebound. Negative signs include Murphy's sign.  Musculoskeletal:     Cervical back: Neck supple.  Skin:    General: Skin is warm and dry.  Neurological:     Mental Status: She is alert and oriented to person, place, and time.  Psychiatric:        Mood and Affect: Mood normal.     ED Results / Procedures / Treatments  Labs (all labs ordered are listed, but only abnormal results are displayed) Labs Reviewed  COMPREHENSIVE METABOLIC PANEL - Abnormal; Notable for the following components:      Result Value   Glucose, Bld 160 (*)    AST 275 (*)    ALT 233 (*)    Total Bilirubin 1.2 (*)    All other components within normal limits  LIPASE, BLOOD - Abnormal; Notable for the following components:   Lipase 84 (*)    All other components within normal limits  CBC WITH DIFFERENTIAL/PLATELET  HCG, QUANTITATIVE, PREGNANCY  URINALYSIS, ROUTINE W REFLEX MICROSCOPIC  PREGNANCY, URINE    EKG None  Radiology US Abdomen Limited RUQ (LIVER/GB)  Result Date: 10/11/2023 CLINICAL DATA:  32 year old female with epigastric pain radiating to the back for 2 days. EXAM: ULTRASOUND ABDOMEN LIMITED RIGHT UPPER  QUADRANT COMPARISON:  [facility] Right upper quadrant ultrasound 03/30/2023. FINDINGS: Gallbladder: Layering sludge or stones (image 2) with some shadowing. Shadowing stones on image 15, individual stone size up to 14 mm. Gallbladder wall thickness remains normal. No pericholecystic fluid. No sonographic Murphy sign elicited. Common bile duct: Diameter: 6-7 mm (previously 4 mm) and mildly dilated for age. No filling defect within the visible duct. Liver: Echogenic liver again noted (images 35, 44). No intrahepatic biliary ductal dilatation or discrete liver lesion. Portal vein is patent on color Doppler imaging with normal direction of blood flow towards the liver. Other: Negative visible right kidney.  No free fluid. IMPRESSION: 1. Positive for cholelithiasis with borderline to mildly dilated CBD. Choledocholithiasis difficult to exclude. No evidence of acute cholecystitis. 2. Hepatic steatosis. Electronically Signed   By: Odessa Fleming M.D.   On: 10/11/2023 06:19    Procedures Procedures    Medications Ordered in ED Medications  morphine (PF) 4 MG/ML injection 4 mg (4 mg Intravenous Given 10/11/23 0428)  ondansetron (ZOFRAN) injection 4 mg (4 mg Intravenous Given 10/11/23 0427)    ED Course/ Medical Decision Making/ A&P Clinical Course as of 10/11/23 0656  Wed Oct 11, 2023  0634 Spoke with patient.  Feel she would benefit from an MRCP.  With a mildly elevated lipase and LFTs, suspect she may have gallstone pancreatitis given cholelithiasis and a borderline dilated common bile duct.  Patient is pain-free.  If MRCP would be reassuring and negative and she remained pain-free, she could potentially go home with surgery follow-up.  She is agreeable to plan. [CH]  [phone] Glucometer issues connecting to server.  CBG post D50  [CH]    Clinical Course User Index [CH] Amyia Lodwick, Mayer Masker, MD                                 Medical Decision Making Amount and/or Complexity of Data Reviewed Labs:  ordered. Radiology: ordered.  Risk Prescription drug management.   This patient presents to the ED for concern of abdominal pain, this involves an extensive number of treatment options, and is a complaint that carries with it a high risk of complications and morbidity.  I considered the following differential and admission for this acute, potentially life threatening condition.  The differential diagnosis includes pancreatitis, gastritis, cholecystitis  MDM:    This is a 32 year old female who presents with epigastric abdominal pain.  Overall nontoxic and vital signs are reassuring.  Description of pain is highly suspicious for pancreatitis.  Gastritis would also be a consideration.  Patient still has her gallbladder.  Patient was given pain and nausea medication.  Labs reviewed.  No leukocytosis.  She does have a slightly elevated lipase at 84.  AST and ALT are elevated in the 200s and bili is 1.2.  Right upper quadrant ultrasound was obtained.  She does have cholelithiasis with a borderline dilated common bile duct.  On recheck, she is pain-free.  Highly suspect that she likely has mild gallstone pancreatitis.  Her labs are suggestive of potential early obstructive pattern.  Given that she is now pain-free, she may have passed the stone; however, feel she would benefit from an MRCP to ensure no ongoing obstructive stone.  If this was negative, she could likely go home and follow-up with general surgery for elective cholecystectomy.  Patient is agreeable to this plan.  Will transfer ED to ED for an MRCP.  Spoke with Dr. Nicanor Alcon, [facility], ED.  (Labs, imaging, consults)  Labs: I Ordered, and personally interpreted labs.  The pertinent results include: CBC, CMP, lipase, pregnancy  Imaging Studies ordered: I ordered imaging studies including right upper quadrant ultrasound I independently visualized and interpreted imaging. I agree with the radiologist interpretation  Additional history  obtained from chart review.  External records from outside source obtained and reviewed including prior evaluations  Cardiac Monitoring: .The patient was maintained on a cardiac monitor.  If on the cardiac monitor, I personally viewed and interpreted the cardiac monitored which showed an underlying rhythm of: sinus rhythm  Reevaluation: After the interventions noted above, I reevaluated the patient and found that they have :improved  Social Determinants of Health: . lives independently  Disposition: Transfer for MRCP  Co morbidities that complicate the patient evaluation . Past Medical History:  Diagnosis Date  . Bilateral ankle fractures   . Frequent headaches    tension headaches per patient  . MRSA (methicillin resistant staph aureus) culture positive    bilater armpits     Medicines Meds ordered this encounter  Medications  . morphine (PF) 4 MG/ML injection 4 mg  . ondansetron (ZOFRAN) injection 4 mg    I have reviewed the patients home medicines and have made adjustments as needed  Problem List / ED Course: Problem List Items Addressed This Visit   None Visit Diagnoses     Other acute pancreatitis, unspecified complication status    -  Primary   Relevant Medications   morphine (PF) 4 MG/ML injection 4 mg (Completed)   Calculus of gallbladder with biliary obstruction but without cholecystitis                       Final Clinical Impression(s) / ED Diagnoses Final diagnoses:  Other acute pancreatitis, unspecified complication status  Calculus of gallbladder with biliary obstruction but without cholecystitis    Rx / DC Orders ED Discharge Orders     None         Shon Baton, MD 10/11/23 [phone]

## 2023-10-11 NOTE — ED Provider Notes (Signed)
32 year old female transferred from [facility] to get a MRI of her abdomen.  She presented there with acute onset of epigastric abdominal pain radiating through to her back.  LFTs and lipase were mildly elevated.  She currently states her pain is 0 out of 10.  She had an ultrasound that showed gallstones and some CBD dilation and there was some concern for possible retained stone.  Patient understands she is waiting for MRI and has no complaints currently. Physical Exam  BP (!) 138/93   Pulse 81   Temp 98.3 F (36.8 C)   Resp 17   Ht 5\' 7"  (1.702 m)   Wt 122.5 kg   SpO2 98%   BMI 42.29 kg/m   Physical Exam  Procedures  Procedures  ED Course / MDM   Clinical Course as of 10/11/23 0737  Wed Oct 11, 2023  0634 Spoke with patient.  Feel she would benefit from an MRCP.  With a mildly elevated lipase and LFTs, suspect she may have gallstone pancreatitis given cholelithiasis and a borderline dilated common bile duct.  Patient is pain-free.  If MRCP would be reassuring and negative and she remained pain-free, she could potentially go home with surgery follow-up.  She is agreeable to plan. [CH]  [phone] Glucometer issues connecting to server.  CBG post D50  [CH]    Clinical Course User Index [CH] Horton, Mayer Masker, MD   Medical Decision Making Amount and/or Complexity of Data Reviewed Labs: ordered. Radiology: ordered.  Risk Prescription drug management.   MRCP does not show any acute obstruction.  Cholelithiasis with minimal pericholecystic stranding.  Patient is pain-free at this time just a little nauseous.  Have placed a call into general surgery to see what their recommendations are.  12 PM.  Patient seen by general surgery PA.  They discussed with the patient that staying and getting her gallbladder surgery would affect her getting her laparoscopic surgery next week.  Since she is feeling better patient is comfortable plan for discharge and further discussion with her  team at [facility] regarding whether they can also help take care of her gallbladder during her bariatric surgery.  We discussed return instructions since she is comfortable plan.     Terrilee Files, MD 10/11/23 1159

## 2023-10-11 NOTE — ED Notes (Signed)
Patient arrived POV from [facility] for a MRI, AOX4, ambulatory to her room, no s/s of distress, will continue to monitor.

## 2023-10-12 ENCOUNTER — Other Ambulatory Visit (HOSPITAL_BASED_OUTPATIENT_CLINIC_OR_DEPARTMENT_OTHER): Payer: Self-pay | Admitting: Emergency Medicine

## 2023-10-12 DIAGNOSIS — K859 Acute pancreatitis without necrosis or infection, unspecified: Secondary | ICD-10-CM

## 2023-10-25 ENCOUNTER — Other Ambulatory Visit: Payer: Self-pay | Admitting: Medical Genetics

## 2024-09-17 ENCOUNTER — Other Ambulatory Visit: Payer: Self-pay | Admitting: Medical Genetics

## 2024-09-17 DIAGNOSIS — Z006 Encounter for examination for normal comparison and control in clinical research program: Secondary | ICD-10-CM
